# Patient Record
Sex: Female | Born: 1980 | Race: White | Hispanic: No | Marital: Married | Smoking: Never smoker
Health system: Southern US, Community
[De-identification: ages and names within clinical notes are randomized; demographics above are authoritative.]

## PROBLEM LIST (undated history)

## (undated) DIAGNOSIS — G43909 Migraine, unspecified, not intractable, without status migrainosus: Secondary | ICD-10-CM

## (undated) DIAGNOSIS — Z973 Presence of spectacles and contact lenses: Secondary | ICD-10-CM

## (undated) DIAGNOSIS — U071 COVID-19: Secondary | ICD-10-CM

## (undated) DIAGNOSIS — J45909 Unspecified asthma, uncomplicated: Secondary | ICD-10-CM

## (undated) HISTORY — PX: NO PAST SURGERIES: SHX2092

---

## 2015-02-23 DIAGNOSIS — M222X2 Patellofemoral disorders, left knee: Secondary | ICD-10-CM | POA: Insufficient documentation

## 2015-02-23 DIAGNOSIS — M2241 Chondromalacia patellae, right knee: Secondary | ICD-10-CM | POA: Insufficient documentation

## 2015-02-23 DIAGNOSIS — M2242 Chondromalacia patellae, left knee: Secondary | ICD-10-CM | POA: Insufficient documentation

## 2015-02-23 DIAGNOSIS — M222X1 Patellofemoral disorders, right knee: Secondary | ICD-10-CM | POA: Insufficient documentation

## 2015-09-29 ENCOUNTER — Emergency Department (HOSPITAL_COMMUNITY): Payer: BLUE CROSS/BLUE SHIELD

## 2015-09-29 ENCOUNTER — Emergency Department (HOSPITAL_COMMUNITY)
Admission: EM | Admit: 2015-09-29 | Discharge: 2015-09-30 | Disposition: A | Discharge status: Home / Self Care | Payer: BLUE CROSS/BLUE SHIELD | Attending: Emergency Medicine | Admitting: Emergency Medicine

## 2015-09-29 ENCOUNTER — Encounter (HOSPITAL_COMMUNITY): Payer: Self-pay

## 2015-09-29 DIAGNOSIS — J9801 Acute bronchospasm: Secondary | ICD-10-CM | POA: Insufficient documentation

## 2015-09-29 DIAGNOSIS — Z79899 Other long term (current) drug therapy: Secondary | ICD-10-CM | POA: Diagnosis not present

## 2015-09-29 DIAGNOSIS — R0789 Other chest pain: Secondary | ICD-10-CM | POA: Diagnosis present

## 2015-09-29 DIAGNOSIS — R079 Chest pain, unspecified: Secondary | ICD-10-CM

## 2015-09-29 LAB — CBC
HCT: 40.8 % (ref 36.0–46.0)
HEMOGLOBIN: 14.1 g/dL (ref 12.0–15.0)
MCH: 29 pg (ref 26.0–34.0)
MCHC: 34.6 g/dL (ref 30.0–36.0)
MCV: 84 fL (ref 78.0–100.0)
Platelets: 210 10*3/uL (ref 150–400)
RBC: 4.86 MIL/uL (ref 3.87–5.11)
RDW: 13.2 % (ref 11.5–15.5)
WBC: 5.7 10*3/uL (ref 4.0–10.5)

## 2015-09-29 LAB — BASIC METABOLIC PANEL
Anion gap: 7 (ref 5–15)
BUN: 13 mg/dL (ref 6–20)
CALCIUM: 9.8 mg/dL (ref 8.9–10.3)
CO2: 22 mmol/L (ref 22–32)
CREATININE: 0.54 mg/dL (ref 0.44–1.00)
Chloride: 108 mmol/L (ref 101–111)
GFR calc Af Amer: >60 mL/min (ref >60)
GLUCOSE: 99 mg/dL (ref 65–99)
Potassium: 3.7 mmol/L (ref 3.5–5.1)
Sodium: 137 mmol/L (ref 135–145)

## 2015-09-29 LAB — I-STAT TROPONIN, ED: TROPONIN I, POC: 0 ng/mL (ref 0.00–0.08)

## 2015-09-29 MED ORDER — ALBUTEROL SULFATE (2.5 MG/3ML) 0.083% IN NEBU
5.0000 mg | INHALATION_SOLUTION | Freq: Once | RESPIRATORY_TRACT | Status: AC
  Administered 2015-09-29: 5 mg via RESPIRATORY_TRACT
  Filled 2015-09-29: qty 6

## 2015-09-29 MED ORDER — IPRATROPIUM BROMIDE 0.02 % IN SOLN
0.5000 mg | Freq: Once | RESPIRATORY_TRACT | Status: AC
  Administered 2015-09-29: 0.5 mg via RESPIRATORY_TRACT
  Filled 2015-09-29: qty 2.5

## 2015-09-29 NOTE — ED Notes (Signed)
Pt has had cough congestion x 2 days.  No fever. Pt states she started having chest pain approx 20 min ago.  Mid breastbone.  Pain worsens with movement.

## 2015-09-30 LAB — D-DIMER, QUANTITATIVE (NOT AT ARMC)

## 2015-09-30 LAB — I-STAT TROPONIN, ED: Troponin i, poc: 0 ng/mL (ref 0.00–0.08)

## 2015-09-30 MED ORDER — DEXAMETHASONE 4 MG PO TABS
10.0000 mg | ORAL_TABLET | Freq: Once | ORAL | Status: AC
  Administered 2015-09-30: 10 mg via ORAL
  Filled 2015-09-30: qty 2

## 2015-09-30 MED ORDER — ALBUTEROL SULFATE HFA 108 (90 BASE) MCG/ACT IN AERS
1.0000 | INHALATION_SPRAY | RESPIRATORY_TRACT | Status: DC | PRN
  Administered 2015-09-30: 1 via RESPIRATORY_TRACT
  Filled 2015-09-30: qty 6.7

## 2015-09-30 NOTE — ED Provider Notes (Signed)
CSN: [id number]     Arrival date & time 09/29/15  1724 History   First MD Initiated Contact with Patient 09/29/15 2259     Chief Complaint  Patient presents with  . Chest Pain  . Cough     (Consider location/radiation/quality/duration/timing/severity/associated sxs/prior Treatment) HPI Comments: 35 year old female with history of childhood asthma presents for chest pain. The patient states that she has been having trouble with her allergies ever since [place]. She says over the last 2 days her allergies have been especially bad. She reports cough and nasal congestion over this time. About 20 minutes prior to arrival in the emergency department she developed sharp chest pain in the left side of her chest that has been made worse with movement. She also reports sudden onset of shortness of breath at that time. Patient is not on any contraceptives or hormone replacement medications. She denies any history of blood clots. No recent immobility or extensive travel.   History reviewed. No pertinent past medical history. History reviewed. No pertinent past surgical history. History reviewed. No pertinent family history. Social History  Substance Use Topics  . Smoking status: Never Smoker   . Smokeless tobacco: None  . Alcohol Use: No   OB History    No data available     Review of Systems  Constitutional: Negative for fever, chills and fatigue.  HENT: Positive for congestion, postnasal drip and rhinorrhea.   Eyes: Negative for visual disturbance.  Respiratory: Positive for cough, chest tightness and shortness of breath. Negative for wheezing.   Cardiovascular: Positive for chest pain. Negative for palpitations and leg swelling.  Gastrointestinal: Negative for nausea, vomiting, abdominal pain, diarrhea and constipation.  Genitourinary: Negative for dysuria, urgency, frequency and flank pain.  Musculoskeletal: Negative for myalgias and back pain.  Skin:  Negative for rash.  Neurological: Negative for dizziness, weakness and headaches.  Hematological: Does not bruise/bleed easily.      Allergies  Review of patient's allergies indicates no known allergies.  Home Medications   Prior to Admission medications   Medication Sig Start Date End Date Taking? Authorizing Provider  cholecalciferol (VITAMIN D) 1000 units tablet Take 1,000 Units by mouth daily.   Yes Historical Provider, MD   BP 100/64 mmHg  Pulse 88  Temp(Src) 97.6 F (36.4 C) (Oral)  Resp 18  SpO2 99%  LMP 09/07/2015 Physical Exam  Constitutional: She is oriented to person, place, and time. She appears well-developed and well-nourished. No distress.  HENT:  Head: Normocephalic and atraumatic.  Right Ear: External ear normal.  Left Ear: External ear normal.  Nose: Nose normal.  Mouth/Throat: Oropharynx is clear and moist. No oropharyngeal exudate.  Eyes: EOM are normal. Pupils are equal, round, and reactive to light.  Neck: Normal range of motion. Neck supple.  Cardiovascular: Normal rate, regular rhythm, normal heart sounds and intact distal pulses.   No murmur heard. Pulmonary/Chest: Effort normal. No respiratory distress. She has no wheezes. She has no rales. She exhibits no tenderness.  Abdominal: Soft. She exhibits no distension. There is no tenderness.  Musculoskeletal: Normal range of motion. She exhibits no edema or tenderness.  Neurological: She is alert and oriented to person, place, and time.  Skin: Skin is warm and dry. No rash noted. She is not diaphoretic.  Vitals reviewed.   ED Course  Procedures (including critical care time) Labs Review Labs Reviewed  BASIC METABOLIC PANEL  CBC  D-DIMER, QUANTITATIVE (NOT AT [facility])  I-STAT TROPOININ,  ED  Rosezena Sensor, ED    Imaging Review Dg Chest 2 View  09/29/2015  CLINICAL DATA:  New onset central chest pain EXAM: CHEST  2 VIEW COMPARISON:  None. FINDINGS: The heart size and mediastinal contours are  within normal limits. Both lungs are clear. The visualized skeletal structures are unremarkable. IMPRESSION: No active cardiopulmonary disease. Electronically Signed   By: Bary Richard M.D.   On: 09/29/2015 18:23   I have personally reviewed and evaluated these images and lab results as part of my medical decision-making.   EKG Interpretation   Date/Time:  Tuesday Sep 29 2015 17:46:47 EDT Ventricular Rate:  74 PR Interval:  146 QRS Duration: 96 QT Interval:  380 QTC Calculation: 422 R Axis:   63 Text Interpretation:  Sinus rhythm normal.  Confirmed by Donnald Garre, MD,  Lebron Conners [phone]) on 09/29/2015 6:02:53 PM      MDM  Patient was seen and evaluated in stable condition. EKG unremarkable. Troponin 2 normal. D-dimer unremarkable and patient low risk for PE. Patient not tachycardic or hypoxic. Chest x-ray unremarkable. Patient felt improved after breathing treatment. Patient was provided with an albuterol inhaler and also given a dose of Decadron. She was instructed to follow up outpatient. Strict return precautions given.  Patient and husband expressed understanding and agreement with plan of care. Final diagnoses:  Chest pain, unspecified chest pain type  Bronchospasm    1. Bronchospasm 2. Chest pain    Leta Baptist, MD 09/30/15 [phone]

## 2015-09-30 NOTE — Discharge Instructions (Signed)
You were seen and evaluated tonight for chest pain. Likely this is related to what is called bronchospasm which is related to allergies and asthma. Please use the inhaler as prescribed. Please follow up outpatient with the primary care physician for reevaluation.  Bronchospasm, Adult A bronchospasm is a spasm or tightening of the airways going into the lungs. During a bronchospasm breathing becomes more difficult because the airways get smaller. When this happens there can be coughing, a whistling sound when breathing (wheezing), and difficulty breathing. Bronchospasm is often associated with asthma, but not all patients who experience a bronchospasm have asthma. CAUSES  A bronchospasm is caused by inflammation or irritation of the airways. The inflammation or irritation may be triggered by:   Allergies (such as to animals, pollen, food, or mold). Allergens that cause bronchospasm may cause wheezing immediately after exposure or many hours later.   Infection. Viral infections are believed to be the most common cause of bronchospasm.   Exercise.   Irritants (such as pollution, cigarette smoke, strong odors, aerosol sprays, and paint fumes).   Weather changes. Winds increase molds and pollens in the air. Rain refreshes the air by washing irritants out. Cold air may cause inflammation.   Stress and emotional upset.  SIGNS AND SYMPTOMS   Wheezing.   Excessive nighttime coughing.   Frequent or severe coughing with a simple cold.   Chest tightness.   Shortness of breath.  DIAGNOSIS  Bronchospasm is usually diagnosed through a history and physical exam. Tests, such as chest X-rays, are sometimes done to look for other conditions. TREATMENT   Inhaled medicines can be given to open up your airways and help you breathe. The medicines can be given using either an inhaler or a nebulizer machine.  Corticosteroid medicines may be given for severe bronchospasm, usually when it is  associated with asthma. HOME CARE INSTRUCTIONS   Always have a plan prepared for seeking medical care. Know when to call your health care provider and local emergency services (911 in the [place] Know where you can access local emergency care.  Only take medicines as directed by your health care provider.  If you were prescribed an inhaler or nebulizer machine, ask your health care provider to explain how to use it correctly. Always use a spacer with your inhaler if you were given one.  It is necessary to remain calm during an attack. Try to relax and breathe more slowly.  Control your home environment in the following ways:   Change your heating and air conditioning filter at least once a month.   Limit your use of fireplaces and wood stoves.  Do not smoke and do not allow smoking in your home.   Avoid exposure to perfumes and fragrances.   Get rid of pests (such as roaches and mice) and their droppings.   Throw away plants if you see mold on them.   Keep your house clean and dust free.   Replace carpet with wood, tile, or vinyl flooring. Carpet can trap dander and dust.   Use allergy-proof pillows, mattress covers, and box spring covers.   Wash bed sheets and blankets every week in hot water and dry them in a dryer.   Use blankets that are made of polyester or cotton.   Wash hands frequently. SEEK MEDICAL CARE IF:   You have muscle aches.   You have chest pain.   The sputum changes from clear or white to yellow, green, gray, or bloody.   The sputum  you cough up gets thicker.   There are problems that may be related to the medicine you are given, such as a rash, itching, swelling, or trouble breathing.  SEEK IMMEDIATE MEDICAL CARE IF:   You have worsening wheezing and coughing even after taking your prescribed medicines.   You have increased difficulty breathing.   You develop severe chest pain. MAKE SURE YOU:   Understand these  instructions.  Will watch your condition.  Will get help right away if you are not doing well or get worse.   This information is not intended to replace advice given to you by your health care provider. Make sure you discuss any questions you have with your health care provider.   Document Released: 04/21/2003 Document Revised: 05/09/2014 Document Reviewed: 10/08/2012 Elsevier Interactive Patient Education 2016 Elsevier Inc.   Chest Pain  Chest pain can be caused by many different conditions. There is always a chance that your pain could be related to something serious, such as a heart attack or a blood clot in your lungs. Chest pain can also be caused by conditions that are not life-threatening. If you have chest pain, it is very important to follow up with your health care provider. CAUSES  Chest pain can be caused by:  Heartburn.  Pneumonia or bronchitis.  Anxiety or stress.  Inflammation around your heart (pericarditis) or lung (pleuritis or pleurisy).  A blood clot in your lung.  A collapsed lung (pneumothorax). It can develop suddenly on its own (spontaneous pneumothorax) or from trauma to the chest.  Shingles infection (varicella-zoster virus).  Heart attack.  Damage to the bones, muscles, and cartilage that make up your chest wall. This can include:  Bruised bones due to injury.  Strained muscles or cartilage due to frequent or repeated coughing or overwork.  Fracture to one or more ribs.  Sore cartilage due to inflammation (costochondritis). RISK FACTORS  Risk factors for chest pain may include:  Activities that increase your risk for trauma or injury to your chest.  Respiratory infections or conditions that cause frequent coughing.  Medical conditions or overeating that can cause heartburn.  Heart disease or family history of heart disease.  Conditions or health behaviors that increase your risk of developing a blood clot.  Having had chicken pox  (varicella zoster). SIGNS AND SYMPTOMS Chest pain can feel like:  Burning or tingling on the surface of your chest or deep in your chest.  Crushing, pressure, aching, or squeezing pain.  Dull or sharp pain that is worse when you move, cough, or take a deep breath.  Pain that is also felt in your back, neck, shoulder, or arm, or pain that spreads to any of these areas. Your chest pain may come and go, or it may stay constant. DIAGNOSIS Lab tests or other studies may be needed to find the cause of your pain. Your health care provider may have you take a test called an ambulatory ECG (electrocardiogram). An ECG records your heartbeat patterns at the time the test is performed. You may also have other tests, such as:  Transthoracic echocardiogram (TTE). During echocardiography, sound waves are used to create a picture of all of the heart structures and to look at how blood flows through your heart.  Transesophageal echocardiogram (TEE).This is a more advanced imaging test that obtains images from inside your body. It allows your health care provider to see your heart in finer detail.  Cardiac monitoring. This allows your health care provider to monitor  your heart rate and rhythm in real time.  Holter monitor. This is a portable device that records your heartbeat and can help to diagnose abnormal heartbeats. It allows your health care provider to track your heart activity for several days, if needed.  Stress tests. These can be done through exercise or by taking medicine that makes your heart beat more quickly.  Blood tests.  Imaging tests. TREATMENT  Your treatment depends on what is causing your chest pain. Treatment may include:  Medicines. These may include:  Acid blockers for heartburn.  Anti-inflammatory medicine.  Pain medicine for inflammatory conditions.  Antibiotic medicine, if an infection is present.  Medicines to dissolve blood clots.  Medicines to treat coronary  artery disease.  Supportive care for conditions that do not require medicines. This may include:  Resting.  Applying heat or cold packs to injured areas.  Limiting activities until pain decreases. HOME CARE INSTRUCTIONS  If you were prescribed an antibiotic medicine, finish it all even if you start to feel better.  Avoid any activities that bring on chest pain.  Do not use any tobacco products, including cigarettes, chewing tobacco, or electronic cigarettes. If you need help quitting, ask your health care provider.  Do not drink alcohol.  Take medicines only as directed by your health care provider.  Keep all follow-up visits as directed by your health care provider. This is important. This includes any further testing if your chest pain does not go away.  If heartburn is the cause for your chest pain, you may be told to keep your head raised (elevated) while sleeping. This reduces the chance that acid will go from your stomach into your esophagus.  Make lifestyle changes as directed by your health care provider. These may include:  Getting regular exercise. Ask your health care provider to suggest some activities that are safe for you.  Eating a heart-healthy diet. A registered dietitian can help you to learn healthy eating options.  Maintaining a healthy weight.  Managing diabetes, if necessary.  Reducing stress. SEEK MEDICAL CARE IF:  Your chest pain does not go away after treatment.  You have a rash with blisters on your chest.  You have a fever. SEEK IMMEDIATE MEDICAL CARE IF:   Your chest pain is worse.  You have an increasing cough, or you cough up blood.  You have severe abdominal pain.  You have severe weakness.  You faint.  You have chills.  You have sudden, unexplained chest discomfort.  You have sudden, unexplained discomfort in your arms, back, neck, or jaw.  You have shortness of breath at any time.  You suddenly start to sweat, or your  skin gets clammy.  You feel nauseous or you vomit.  You suddenly feel light-headed or dizzy.  Your heart begins to beat quickly, or it feels like it is skipping beats. These symptoms may represent a serious problem that is an emergency. Do not wait to see if the symptoms will go away. Get medical help right away. Call your local emergency services (911 in the [place] Do not drive yourself to the hospital.   This information is not intended to replace advice given to you by your health care provider. Make sure you discuss any questions you have with your health care provider.   Document Released: 01/26/2005 Document Revised: 05/09/2014 Document Reviewed: 11/22/2013 Elsevier Interactive Patient Education [employer]

## 2016-11-28 LAB — OB RESULTS CONSOLE GC/CHLAMYDIA
Chlamydia: NEGATIVE
GC PROBE AMP, GENITAL: NEGATIVE

## 2016-11-28 LAB — OB RESULTS CONSOLE ABO/RH: RH Type: POSITIVE

## 2016-11-28 LAB — OB RESULTS CONSOLE RUBELLA ANTIBODY, IGM: RUBELLA: IMMUNE

## 2016-11-28 LAB — OB RESULTS CONSOLE HIV ANTIBODY (ROUTINE TESTING): HIV: NONREACTIVE

## 2016-11-28 LAB — OB RESULTS CONSOLE HEPATITIS B SURFACE ANTIGEN: Hepatitis B Surface Ag: NEGATIVE

## 2016-11-28 LAB — OB RESULTS CONSOLE ANTIBODY SCREEN: ANTIBODY SCREEN: NEGATIVE

## 2016-11-28 LAB — OB RESULTS CONSOLE RPR: RPR: NONREACTIVE

## 2017-05-02 NOTE — L&D Delivery Note (Signed)
Delivery Note Pt reached complete and had prolonged deceleration.  Was in hands and knees when provider arrived.  Pt complete/complete and zero station.  Pt pushed very well for 20 min.  After several pushes with severe variables baby to +2, a vacuum was applied after verbal consent with 1 ctx and 1 pull had a pop-off.  With next contraction had excellent progress, decision made to proceed w/o vacuum. With great effort and 3 more pushes pt delivered.   At 2:53 AM a viable and healthy female was delivered via Vaginal, Spontaneous (Presentation:OA; LOT ).  APGAR: 9, 9; weight P .   Placenta status: delivered, intact .  Cord: 3V with the following complications: none.    Anesthesia:  epidural Episiotomy:  none Lacerations:  Vaginal laceratiion x 3 Suture Repair: 3.0 vicryl rapide Est. Blood Loss (mL):  200cc  Mom to postpartum.  Baby to Couplet care / Skin to Skin.  Yesenia [name] 06/13/2017, 3:17 AM  Br/A+/RI/Tdap in PNC/Contra ?

## 2017-05-03 LAB — OB RESULTS CONSOLE GBS: STREP GROUP B AG: NEGATIVE

## 2017-06-01 ENCOUNTER — Encounter (HOSPITAL_COMMUNITY): Payer: Self-pay | Admitting: *Deleted

## 2017-06-01 ENCOUNTER — Telehealth (HOSPITAL_COMMUNITY): Payer: Self-pay | Admitting: *Deleted

## 2017-06-01 NOTE — Telephone Encounter (Signed)
Preadmission screen  

## 2017-06-12 ENCOUNTER — Inpatient Hospital Stay (HOSPITAL_COMMUNITY)
Admission: AD | Admit: 2017-06-12 | Discharge: 2017-06-14 | DRG: 807 | Disposition: A | Discharge status: Home / Self Care | Payer: Managed Care, Other (non HMO) | Source: Ambulatory Visit | Attending: Obstetrics and Gynecology | Admitting: Obstetrics and Gynecology

## 2017-06-12 ENCOUNTER — Other Ambulatory Visit: Payer: Self-pay

## 2017-06-12 ENCOUNTER — Encounter (HOSPITAL_COMMUNITY): Payer: Self-pay | Admitting: *Deleted

## 2017-06-12 DIAGNOSIS — O9902 Anemia complicating childbirth: Secondary | ICD-10-CM | POA: Diagnosis present

## 2017-06-12 DIAGNOSIS — E669 Obesity, unspecified: Secondary | ICD-10-CM | POA: Diagnosis present

## 2017-06-12 DIAGNOSIS — O99214 Obesity complicating childbirth: Secondary | ICD-10-CM | POA: Diagnosis present

## 2017-06-12 DIAGNOSIS — Z3A40 40 weeks gestation of pregnancy: Secondary | ICD-10-CM

## 2017-06-12 DIAGNOSIS — D649 Anemia, unspecified: Secondary | ICD-10-CM | POA: Diagnosis present

## 2017-06-12 DIAGNOSIS — O4292 Full-term premature rupture of membranes, unspecified as to length of time between rupture and onset of labor: Secondary | ICD-10-CM | POA: Diagnosis present

## 2017-06-12 HISTORY — DX: Unspecified asthma, uncomplicated: J45.909

## 2017-06-12 LAB — CBC
HEMATOCRIT: 34.6 % — AB (ref 36.0–46.0)
Hemoglobin: 11.9 g/dL — ABNORMAL LOW (ref 12.0–15.0)
MCH: 28 pg (ref 26.0–34.0)
MCHC: 34.4 g/dL (ref 30.0–36.0)
MCV: 81.4 fL (ref 78.0–100.0)
Platelets: 195 10*3/uL (ref 150–400)
RBC: 4.25 MIL/uL (ref 3.87–5.11)
RDW: 14 % (ref 11.5–15.5)
WBC: 8.6 10*3/uL (ref 4.0–10.5)

## 2017-06-12 LAB — TYPE AND SCREEN
ABO/RH(D): A POS
Antibody Screen: NEGATIVE

## 2017-06-12 LAB — POCT FERN TEST: POCT FERN TEST: POSITIVE

## 2017-06-12 LAB — ABO/RH: ABO/RH(D): A POS

## 2017-06-12 MED ORDER — EPHEDRINE 5 MG/ML INJ
10.0000 mg | INTRAVENOUS | Status: DC | PRN
  Filled 2017-06-12: qty 2

## 2017-06-12 MED ORDER — PHENYLEPHRINE 40 MCG/ML (10ML) SYRINGE FOR IV PUSH (FOR BLOOD PRESSURE SUPPORT)
80.0000 ug | PREFILLED_SYRINGE | INTRAVENOUS | Status: DC | PRN
  Filled 2017-06-12: qty 10
  Filled 2017-06-12: qty 5

## 2017-06-12 MED ORDER — OXYCODONE-ACETAMINOPHEN 5-325 MG PO TABS: 1.0000 | ORAL_TABLET | ORAL | Status: DC | PRN

## 2017-06-12 MED ORDER — OXYCODONE-ACETAMINOPHEN 5-325 MG PO TABS: 2.0000 | ORAL_TABLET | ORAL | Status: DC | PRN

## 2017-06-12 MED ORDER — ACETAMINOPHEN 325 MG PO TABS: 650.0000 mg | ORAL_TABLET | ORAL | Status: DC | PRN

## 2017-06-12 MED ORDER — LACTATED RINGERS IV SOLN
INTRAVENOUS | Status: DC
  Administered 2017-06-12 – 2017-06-13 (×2): via INTRAVENOUS

## 2017-06-12 MED ORDER — OXYTOCIN 40 UNITS IN LACTATED RINGERS INFUSION - SIMPLE MED
2.5000 [IU]/h | INTRAVENOUS | Status: DC
  Administered 2017-06-13: 2.5 [IU]/h via INTRAVENOUS

## 2017-06-12 MED ORDER — OXYTOCIN BOLUS FROM INFUSION
500.0000 mL | Freq: Once | INTRAVENOUS | Status: AC
  Administered 2017-06-13: 500 mL via INTRAVENOUS

## 2017-06-12 MED ORDER — LIDOCAINE HCL (PF) 1 % IJ SOLN
30.0000 mL | INTRAMUSCULAR | Status: DC | PRN
  Filled 2017-06-12: qty 30

## 2017-06-12 MED ORDER — SOD CITRATE-CITRIC ACID 500-334 MG/5ML PO SOLN: 30.0000 mL | ORAL | Status: DC | PRN

## 2017-06-12 MED ORDER — OXYTOCIN 40 UNITS IN LACTATED RINGERS INFUSION - SIMPLE MED
1.0000 m[IU]/min | INTRAVENOUS | Status: DC
  Administered 2017-06-12: 2 m[IU]/min via INTRAVENOUS
  Filled 2017-06-12: qty 1000

## 2017-06-12 MED ORDER — BUTORPHANOL TARTRATE 1 MG/ML IJ SOLN: 1.0000 mg | INTRAMUSCULAR | Status: DC | PRN

## 2017-06-12 MED ORDER — TERBUTALINE SULFATE 1 MG/ML IJ SOLN
0.2500 mg | Freq: Once | INTRAMUSCULAR | Status: DC | PRN
  Filled 2017-06-12: qty 1

## 2017-06-12 MED ORDER — ONDANSETRON HCL 4 MG/2ML IJ SOLN: 4.0000 mg | Freq: Four times a day (QID) | INTRAMUSCULAR | Status: DC | PRN

## 2017-06-12 MED ORDER — LACTATED RINGERS IV SOLN: 500.0000 mL | Freq: Once | INTRAVENOUS | Status: DC

## 2017-06-12 MED ORDER — PHENYLEPHRINE 40 MCG/ML (10ML) SYRINGE FOR IV PUSH (FOR BLOOD PRESSURE SUPPORT)
80.0000 ug | PREFILLED_SYRINGE | INTRAVENOUS | Status: DC | PRN
  Filled 2017-06-12: qty 5

## 2017-06-12 MED ORDER — LACTATED RINGERS IV SOLN: 500.0000 mL | INTRAVENOUS | Status: DC | PRN

## 2017-06-12 MED ORDER — DIPHENHYDRAMINE HCL 50 MG/ML IJ SOLN: 12.5000 mg | INTRAMUSCULAR | Status: DC | PRN

## 2017-06-12 MED ORDER — FENTANYL 2.5 MCG/ML BUPIVACAINE 1/10 % EPIDURAL INFUSION (WH - ANES)
14.0000 mL/h | INTRAMUSCULAR | Status: DC | PRN
  Administered 2017-06-13: 14 mL/h via EPIDURAL
  Filled 2017-06-12: qty 100

## 2017-06-12 NOTE — Anesthesia Pain Management Evaluation Note (Signed)
  CRNA Pain Management Visit Note  Patient: Yesenia [name], 37 y.o., female  "Hello I am a member of the anesthesia team at [facility]. We have an anesthesia team available at all times to provide care throughout the hospital, including epidural management and anesthesia for C-section. I don't know your plan for the delivery whether it a natural birth, water birth, IV sedation, nitrous supplementation, doula or epidural, but we want to meet your pain goals."   1.Was your pain managed to your expectations on prior hospitalizations?   Yes   2.What is your expectation for pain management during this hospitalization?     Epidural  3.How can we help you reach that goal? Epidural   Record the patient's initial score and the patient's pain goal.   Pain: 8  Pain Goal: 3 [facility] wants you to be able to say your pain was always managed very well.  Yesenia [name] 06/12/2017

## 2017-06-12 NOTE — MAU Note (Signed)
Pt presents with c/o LOF since 1615.  Reports fluid is brown, has had blood tinged discharge all day.  Denies VB.  Reports +FM.   Scheduled for IOL tomorrow.

## 2017-06-12 NOTE — H&P (Signed)
Richard MiuStephanie Ishaq is a 37 y.o. female G3P2002 at 8140+ with ROM for moderate meconium. ROM at 4:30pm.  [facility].    OB History    Gravida Para Term Preterm AB Living   3 2 2     2    SAB TAB Ectopic Multiple Live Births           2    G1 and G2 - SVD x 2 IOL - 8#7 and 8#9 (2014, 2016) G3 present  No abn pap, last 2016 No STDs  Past Medical History:  Diagnosis Date  . Asthma   migraines  History reviewed. No pertinent surgical history.   Family History: family history includes Diabetes in her father; Hypertension in her father and mother; Kidney disease in her father.   Social History:  reports that  has never smoked. she has never used smokeless tobacco. She reports that she does not drink alcohol or use drugs.  Married, Solicitortrains principals and asst IT trainerprincipals  Meds Butalbital and PNV All NKDA     Maternal Diabetes: No Genetic Screening: Normal Maternal Ultrasounds/Referrals: Normal Fetal Ultrasounds or other Referrals:  None Maternal Substance Abuse:  No Significant Maternal Medications:  None Significant Maternal Lab Results:  Lab values include: Group B Strep negative Other Comments:  Panorama low risk  Review of Systems  Constitutional: Negative.   HENT: Negative.   Eyes: Negative.   Respiratory: Negative.   Cardiovascular: Negative.   Gastrointestinal: Negative.   Genitourinary: Negative.   Musculoskeletal: Positive for back pain.  Skin: Negative.   Neurological: Negative.   Psychiatric/Behavioral: Negative.    Maternal Medical History:  Reason for admission: Rupture of membranes and contractions.   Contractions: Frequency: irregular.    Fetal activity: Perceived fetal activity is normal.    Prenatal Complications - Diabetes: none.    Dilation: 4 Effacement (%): 50 Station: -2 Exam by:: Dr. Ellyn HackBovard Blood pressure 127/76, pulse 76, temperature 98.6 F (37 C), temperature source Axillary, resp. rate 18,  height 5\' 5"  (1.651 m), weight 96.3 kg (212 lb 4 oz), SpO2 99 %. Maternal Exam:  Abdomen: Fundal height is appropriate for gestation.   Estimated fetal weight is 8-9#.   Fetal presentation: vertex  Pelvis: adequate for delivery.   Cervix: Cervix evaluated by digital exam.     Physical Exam  Constitutional: She is oriented to person, place, and time. She appears well-developed and well-nourished.  HENT:  Head: Normocephalic and atraumatic.  Cardiovascular: Normal rate and regular rhythm.  Respiratory: Effort normal and breath sounds normal. No respiratory distress. She has no wheezes.  GI: Soft. Bowel sounds are normal. She exhibits no distension. There is no tenderness.  Musculoskeletal: Normal range of motion.  Neurological: She is alert and oriented to person, place, and time.  Skin: Skin is warm and dry.  Psychiatric: She has a normal mood and affect. Her behavior is normal.    Prenatal labs: ABO, Rh: --/--/A POS (02/11 1753) Antibody: NEG (02/11 1753) Rubella: Immune (07/30 0000) RPR: Nonreactive (07/30 0000)  HBsAg: Negative (07/30 0000)  HIV: Non-reactive (07/30 0000)  GBS: Negative (01/02 0000)   Hgb 13.6/Plt 238/Ur Cx neg/ GC neg/Chl neg/RNI/AFP WNL/Panorama low risk female/glucola 130  Nl anat x heart, cwd Completed anat - cwd, nl anat, ant plac  Received Tdap and flu vaccines in pregnancy  Assessment/Plan: 36yo G3P2 at 40+ with ROM Expect SVD Epidural prn for pain Pitocin to augment   Shakemia Madera Bovard-Stuckert 06/12/2017, 7:08 PM

## 2017-06-12 NOTE — Progress Notes (Signed)
Patient ID: Yesenia MiuStephanie [name], female   DOB: August 07, 1980, 37 y.o.   MRN: [id number]   Uncomfortable with ctx  AFVSS gen NAD FHTs 135, mod var, + accels, category 1 toco  q 3-525min  IUPC placed w/o diff/comp  5.5/70/-1  Continue current mgmt, adjust pitocin prn

## 2017-06-13 ENCOUNTER — Inpatient Hospital Stay (HOSPITAL_COMMUNITY): Payer: Managed Care, Other (non HMO) | Admitting: Anesthesiology

## 2017-06-13 ENCOUNTER — Encounter (HOSPITAL_COMMUNITY): Payer: Self-pay | Admitting: Anesthesiology

## 2017-06-13 ENCOUNTER — Inpatient Hospital Stay (HOSPITAL_COMMUNITY): Admission: RE | Admit: 2017-06-13 | Payer: Managed Care, Other (non HMO) | Source: Ambulatory Visit

## 2017-06-13 LAB — RPR: RPR: NONREACTIVE

## 2017-06-13 MED ORDER — SIMETHICONE 80 MG PO CHEW: 80.0000 mg | CHEWABLE_TABLET | ORAL | Status: DC | PRN

## 2017-06-13 MED ORDER — OXYCODONE HCL 5 MG PO TABS: 5.0000 mg | ORAL_TABLET | ORAL | Status: DC | PRN

## 2017-06-13 MED ORDER — ZOLPIDEM TARTRATE 5 MG PO TABS: 5.0000 mg | ORAL_TABLET | Freq: Every evening | ORAL | Status: DC | PRN

## 2017-06-13 MED ORDER — BENZOCAINE-MENTHOL 20-0.5 % EX AERO
1.0000 "application " | INHALATION_SPRAY | CUTANEOUS | Status: DC | PRN
  Administered 2017-06-13: 1 via TOPICAL
  Filled 2017-06-13: qty 56

## 2017-06-13 MED ORDER — DIPHENHYDRAMINE HCL 25 MG PO CAPS: 25.0000 mg | ORAL_CAPSULE | Freq: Four times a day (QID) | ORAL | Status: DC | PRN

## 2017-06-13 MED ORDER — SENNOSIDES-DOCUSATE SODIUM 8.6-50 MG PO TABS
2.0000 | ORAL_TABLET | ORAL | Status: DC
  Administered 2017-06-14: 2 via ORAL
  Filled 2017-06-13: qty 2

## 2017-06-13 MED ORDER — ONDANSETRON HCL 4 MG PO TABS: 4.0000 mg | ORAL_TABLET | ORAL | Status: DC | PRN

## 2017-06-13 MED ORDER — MISOPROSTOL 200 MCG PO TABS
800.0000 ug | ORAL_TABLET | Freq: Once | ORAL | Status: AC
  Administered 2017-06-13: 800 ug via BUCCAL
  Filled 2017-06-13: qty 4

## 2017-06-13 MED ORDER — LIDOCAINE HCL (PF) 1 % IJ SOLN
INTRAMUSCULAR | Status: DC | PRN
  Administered 2017-06-13 (×2): 4 mL via EPIDURAL

## 2017-06-13 MED ORDER — PRENATAL MULTIVITAMIN CH
1.0000 | ORAL_TABLET | Freq: Every day | ORAL | Status: DC
  Administered 2017-06-13: 1 via ORAL
  Filled 2017-06-13: qty 1

## 2017-06-13 MED ORDER — ACETAMINOPHEN 325 MG PO TABS
650.0000 mg | ORAL_TABLET | ORAL | Status: DC | PRN
  Administered 2017-06-13: 650 mg via ORAL
  Filled 2017-06-13: qty 2

## 2017-06-13 MED ORDER — LACTATED RINGERS IV SOLN
500.0000 mL | Freq: Once | INTRAVENOUS | Status: AC
  Administered 2017-06-12: 500 mL via INTRAVENOUS

## 2017-06-13 MED ORDER — IBUPROFEN 600 MG PO TABS
600.0000 mg | ORAL_TABLET | Freq: Four times a day (QID) | ORAL | Status: DC
  Administered 2017-06-13 – 2017-06-14 (×5): 600 mg via ORAL
  Filled 2017-06-13 (×5): qty 1

## 2017-06-13 MED ORDER — OXYCODONE HCL 5 MG PO TABS: 10.0000 mg | ORAL_TABLET | ORAL | Status: DC | PRN

## 2017-06-13 MED ORDER — COCONUT OIL OIL: 1.0000 "application " | TOPICAL_OIL | Status: DC | PRN

## 2017-06-13 MED ORDER — WITCH HAZEL-GLYCERIN EX PADS: 1.0000 "application " | MEDICATED_PAD | CUTANEOUS | Status: DC | PRN

## 2017-06-13 MED ORDER — LACTATED RINGERS IV SOLN: INTRAVENOUS | Status: DC

## 2017-06-13 MED ORDER — DIBUCAINE 1 % RE OINT: 1.0000 "application " | TOPICAL_OINTMENT | RECTAL | Status: DC | PRN

## 2017-06-13 MED ORDER — MEASLES, MUMPS & RUBELLA VAC ~~LOC~~ INJ: 0.5000 mL | INJECTION | Freq: Once | SUBCUTANEOUS | Status: DC

## 2017-06-13 MED ORDER — ONDANSETRON HCL 4 MG/2ML IJ SOLN: 4.0000 mg | INTRAMUSCULAR | Status: DC | PRN

## 2017-06-13 NOTE — Anesthesia Procedure Notes (Signed)
Epidural Patient location during procedure: OB Start time: 06/13/2017 12:07 AM  Staffing Anesthesiologist: Mal AmabileFoster, Exzavier Ruderman, MD Performed: anesthesiologist   Preanesthetic Checklist Completed: patient identified, site marked, surgical consent, pre-op evaluation, timeout performed, IV checked, risks and benefits discussed and monitors and equipment checked  Epidural Patient position: sitting Prep: site prepped and draped and DuraPrep Patient monitoring: continuous pulse ox and blood pressure Approach: midline Location: L3-L4 Injection technique: LOR air  Needle:  Needle type: Tuohy  Needle gauge: 17 G Needle length: 9 cm and 9 Needle insertion depth: 5 cm cm Catheter type: closed end flexible Catheter size: 19 Gauge Catheter at skin depth: 10 cm Test dose: negative and Other  Assessment Events: blood not aspirated, injection not painful, no injection resistance, negative IV test and no paresthesia  Additional Notes Patient identified. Risks and benefits discussed including failed block, incomplete  Pain control, post dural puncture headache, nerve damage, paralysis, blood pressure Changes, nausea, vomiting, reactions to medications-both toxic and allergic and post Partum back pain. All questions were answered. Patient expressed understanding and wished to proceed. Sterile technique was used throughout procedure. Epidural site was Dressed with sterile barrier dressing. No paresthesias, signs of intravascular injection Or signs of intrathecal spread were encountered.  Patient was more comfortable after the epidural was dosed. Please see RN's note for documentation of vital signs and FHR which are stable.

## 2017-06-13 NOTE — Consult Note (Signed)
NICU delivery team called to attend the delivery for fetal bradycardia.  Team dismissed by Dr. Ellyn HackBovard right after infant was delivered and came out crying vigorously.   Overton MamMary Ann T Kevia Zaucha, MD (Attending Neonatologist)

## 2017-06-13 NOTE — Lactation Note (Signed)
This note was copied from a baby's chart. Lactation Consultation Note  Patient Name: Yesenia [name]: 06/13/2017 Reason for consult: Initial assessment;Term Breastfeeding consultation services and support information given and reviewed.  This is mom's third baby and she breastfed previous babies without difficulty.  Newborn is 8 hours old and hasn't latched but spoon fed twice.  Assisted with positioning baby in football hold on right side.  Baby has a recessed chin.  Colostrum easily expressed by hand expression.  Baby opened and latched well suckling off and on for 5 minutes then fell asleep.  Waking techniques and breast massage done but baby showing no interest in feeding.  Instructed to feed with cues and call out for assist prn.  Maternal Data Has patient been taught Hand Expression?: Yes Does the patient have breastfeeding experience prior to this delivery?: Yes  Feeding Feeding Type: Breast Fed Length of feed: 5 min  LATCH Score Latch: Grasps breast easily, tongue down, lips flanged, rhythmical sucking.  Audible Swallowing: None  Type of Nipple: Everted at rest and after stimulation  Comfort (Breast/Nipple): Soft / non-tender  Hold (Positioning): Assistance needed to correctly position infant at breast and maintain latch.  LATCH Score: 7  Interventions Interventions: Breast feeding basics reviewed;Assisted with latch;Breast compression;Skin to skin;Adjust position;Breast massage;Support pillows;Hand express;Position options  Lactation Tools Discussed/Used     Consult Status Consult Status: Follow-up [name]: 06/14/17 Follow-up type: In-patient    Huston FoleyMOULDEN, Alijah Hyde S 06/13/2017, 11:45 AM

## 2017-06-13 NOTE — Progress Notes (Signed)
DOD Subjective: no complaints except tired.  Pain minimal  Objective: Blood pressure 124/61, pulse 79, temperature 99 F (37.2 C), temperature source Oral, resp. rate 18, height 5\' 5"  (1.651 m), weight 96.3 kg (212 lb 4 oz), SpO2 100 %.  Physical Exam:  General: alert and cooperative Lochia: appropriate Uterine Fundus: firm   Recent Labs    06/12/17 1753  HGB 11.9*  HCT 34.6*    Assessment/Plan: Plan for discharge tomorrow if patient doing well.   LOS: 1 day   Oliver PilaKathy W Adriel [name] 06/13/2017, 9:29 AM

## 2017-06-13 NOTE — Anesthesia Preprocedure Evaluation (Addendum)
Anesthesia Evaluation  Patient identified by MRN, date of birth, ID band Patient awake    Reviewed: Allergy & Precautions, Patient's Chart, lab work & pertinent test results  Airway Mallampati: II  TM Distance: >3 FB Neck ROM: Full    Dental no notable dental hx. (+) Teeth Intact   Pulmonary asthma ,    Pulmonary exam normal breath sounds clear to auscultation       Cardiovascular negative cardio ROS Normal cardiovascular exam Rhythm:Regular Rate:Normal     Neuro/Psych negative neurological ROS  negative psych ROS   GI/Hepatic Neg liver ROS, GERD  ,  Endo/Other  Obesity  Renal/GU negative Renal ROS  negative genitourinary   Musculoskeletal negative musculoskeletal ROS (+)   Abdominal (+) + obese,   Peds  Hematology  (+) anemia ,   Anesthesia Other Findings   Reproductive/Obstetrics (+) Pregnancy                             Anesthesia Physical Anesthesia Plan  ASA: II  Anesthesia Plan: Epidural   Post-op Pain Management:    Induction:   PONV Risk Score and Plan:   Airway Management Planned: Natural Airway  Additional Equipment:   Intra-op Plan:   Post-operative Plan:   Informed Consent: I have reviewed the patients History and Physical, chart, labs and discussed the procedure including the risks, benefits and alternatives for the proposed anesthesia with the patient or authorized representative who has indicated his/her understanding and acceptance.     Plan Discussed with: Anesthesiologist  Anesthesia Plan Comments:         Anesthesia Quick Evaluation  

## 2017-06-13 NOTE — Anesthesia Postprocedure Evaluation (Signed)
Anesthesia Post Note  Patient: Richard MiuStephanie Snowberger  Procedure(s) Performed: AN AD HOC LABOR EPIDURAL     Patient location during evaluation: Mother Baby Anesthesia Type: Epidural Level of consciousness: awake and alert Pain management: pain level controlled Vital Signs Assessment: post-procedure vital signs reviewed and stable Respiratory status: spontaneous breathing, nonlabored ventilation and respiratory function stable Cardiovascular status: stable Postop Assessment: no headache, no backache and epidural receding Anesthetic complications: no    Last Vitals:  Vitals:   06/13/17 0500 06/13/17 0600  BP: (!) 125/52 124/61  Pulse: 76 79  Resp:  18  Temp: 37.6 C 37.2 C  SpO2:      Last Pain:  Vitals:   06/13/17 0600  TempSrc: Oral  PainSc:    Pain Goal: Patients Stated Pain Goal: 7 (06/12/17 1800)               Marrion CoyMERRITT,Gregorio Worley

## 2017-06-14 ENCOUNTER — Encounter (HOSPITAL_COMMUNITY): Payer: Self-pay | Admitting: *Deleted

## 2017-06-14 LAB — CBC
HCT: 30.4 % — ABNORMAL LOW (ref 36.0–46.0)
Hemoglobin: 10.1 g/dL — ABNORMAL LOW (ref 12.0–15.0)
MCH: 27.2 pg (ref 26.0–34.0)
MCHC: 33.2 g/dL (ref 30.0–36.0)
MCV: 81.9 fL (ref 78.0–100.0)
PLATELETS: 178 10*3/uL (ref 150–400)
RBC: 3.71 MIL/uL — ABNORMAL LOW (ref 3.87–5.11)
RDW: 14.3 % (ref 11.5–15.5)
WBC: 5.9 10*3/uL (ref 4.0–10.5)

## 2017-06-14 MED ORDER — IBUPROFEN 600 MG PO TABS: 600.0000 mg | ORAL_TABLET | Freq: Four times a day (QID) | ORAL | 0 refills | Status: DC

## 2017-06-14 NOTE — Lactation Note (Signed)
This note was copied from a baby's chart. Lactation Consultation Note Baby 23 hrs old. Baby screaming, will not latch. Baby has very high palate, over bite and under bite.  Mom has med. Large everted nipples. Baby has small mouth. Applied #20 NS. LC feels that #24 would fit better for mom's nipple.  Hand expressed colostrum, inserted into NS w/curve tip syring. Baby wouldn't latch, only scream.  Baby taken from breast, mom held STS.  Mom stated baby hasn't slept much. Discussed cluster feeding. Asked mom to call for next feeding for latching assistance and NS fitting.   Mom's 3rd baby. Her 1st baby had mouth and jaw like this baby, eventually adjusted. Didn't use NS. \  Patient Name: Yesenia [name] UJWJX'BToday's Date: 06/14/2017 Reason for consult: Follow-up assessment;Difficult latch   Maternal Data    Feeding Feeding Type: Breast Fed Length of feed: 0 min  LATCH Score Latch: Too sleepy or reluctant, no latch achieved, no sucking elicited.  Audible Swallowing: None  Type of Nipple: Everted at rest and after stimulation  Comfort (Breast/Nipple): Soft / non-tender  Hold (Positioning): Assistance needed to correctly position infant at breast and maintain latch.  LATCH Score: 5  Interventions Interventions: Breast feeding basics reviewed;Assisted with latch;Breast compression;Skin to skin;Adjust position;Breast massage;Support pillows;Hand express;Position options;Expressed milk  Lactation Tools Discussed/Used     Consult Status Consult Status: Follow-up Date: 06/14/17 Follow-up type: In-patient    Charyl DancerCARVER, Rajon Bisig G 06/14/2017, 2:51 AM

## 2017-06-14 NOTE — Discharge Instructions (Signed)
As per discharge pamphlet °

## 2017-06-14 NOTE — Progress Notes (Signed)
PPD #1 No problems, wants to go home Afeb, VSS Fundus firm, NT at U-1 Continue routine postpartum care, d/c home if baby able to go 

## 2017-06-14 NOTE — Lactation Note (Signed)
This note was copied from a baby's chart. Lactation Consultation Note  Patient Name: Girl Richard MiuStephanie Ovens [id number]'WToday's Date: 06/14/2017 Reason for consult: Follow-up assessment;Mother's request Mom latched baby well using the cross cradle hold.  Observed active suck/swallows.  Instructed to feed with cues using good breast massage during feeding.  Lactation outpatient services and support reviewed and encouraged prn.  Maternal Data    Feeding Feeding Type: Breast Fed Length of feed: 10 min  LATCH Score Latch: Grasps breast easily, tongue down, lips flanged, rhythmical sucking.  Audible Swallowing: A few with stimulation  Type of Nipple: Everted at rest and after stimulation  Comfort (Breast/Nipple): Soft / non-tender  Hold (Positioning): No assistance needed to correctly position infant at breast.  LATCH Score: 9  Interventions    Lactation Tools Discussed/Used     Consult Status Consult Status: Complete    Huston FoleyMOULDEN, Zyriah Mask S 06/14/2017, 10:28 AM

## 2017-06-14 NOTE — Discharge Summary (Signed)
OB Discharge Summary     Patient Name: Richard MiuStephanie Reller DOB: [birth year] MRN: [id number]  Date of admission: 06/12/2017 Delivering MD: Sherian ReinBOVARD-STUCKERT, JODY   Date of discharge: 06/14/2017  Admitting diagnosis: 40.5WKS WATER BROKE Intrauterine pregnancy: 15101w0d     Secondary diagnosis:  Principal Problem:   SVD (spontaneous vaginal delivery) Active Problems:   Indication for care in labor or delivery   Liveborn infant      Discharge diagnosis: Term Pregnancy Delivered                                  Hospital course:  Onset of Labor With Vaginal Delivery     37 y.o. yo G3P2002 at 40101w0d was admitted in Active Labor on 06/12/2017. Patient had an uncomplicated labor course as follows:  Membrane Rupture Time/Date: 4:15 PM ,06/12/2017   Intrapartum Procedures: Episiotomy:                                           Lacerations:     Patient had a delivery of a Viable infant. 06/13/2017  Information for the patient's newborn:  Lowella Delldams, Girl Vista [[id number]][[id number]]  Delivery Method: Vaginal, Spontaneous(Filed from Delivery Summary)    Pateint had an uncomplicated postpartum course.  She is ambulating, tolerating a regular diet, passing flatus, and urinating well. Patient is discharged home in stable condition on 06/14/17.   Physical exam  Vitals:   06/13/17 0600 06/13/17 1035 06/13/17 1750 06/14/17 0622  BP: 124/61 122/70 120/70 124/79  Pulse: 79 90 73 74  Resp: 18 18 18 18   Temp: 99 F (37.2 C) 98 F (36.7 C) 98.2 F (36.8 C) 97.8 F (36.6 C)  TempSrc: Oral Oral Oral Oral  SpO2:    99%  Weight:      Height:       General: alert Lochia: appropriate Uterine Fundus: firm  Labs: Lab Results  Component Value Date   WBC 5.9 06/14/2017   HGB 10.1 (L) 06/14/2017   HCT 30.4 (L) 06/14/2017   MCV 81.9 06/14/2017   PLT 178 06/14/2017   CMP Latest Ref Rng & Units 09/29/2015  Glucose 65 - 99 mg/dL 99  BUN 6 - 20 mg/dL 13  Creatinine 1.300.44 - 8.651.00 mg/dL 7.840.54  Sodium 696135 - 295145 mmol/L  137  Potassium 3.5 - 5.1 mmol/L 3.7  Chloride 101 - 111 mmol/L 108  CO2 22 - 32 mmol/L 22  Calcium 8.9 - 10.3 mg/dL 9.8    Discharge instruction: per After Visit Summary and "Baby and Me Booklet".  After visit meds:  Allergies as of 06/14/2017   No Known Allergies     Medication List    TAKE these medications   ibuprofen 600 MG tablet Commonly known as:  ADVIL,MOTRIN Take 1 tablet (600 mg total) by mouth every 6 (six) hours.   prenatal multivitamin Tabs tablet Take 1 tablet by mouth daily at 12 noon.       Diet: routine diet  Activity: Advance as tolerated. Pelvic rest for 6 weeks.   Outpatient follow up:6 weeks  Newborn Data: Live born female  Birth Weight: 7 lb 4.5 oz (3303 g) APGAR: 9, 9  Newborn Delivery   Birth date/time:  06/13/2017 02:53:00 Delivery type:  Vaginal, Spontaneous     Baby Feeding: Breast Disposition:home with mother  06/14/2017 Zenaida Niece, MD

## 2017-06-14 NOTE — Lactation Note (Signed)
This note was copied from a baby's chart. Lactation Consultation Note  Patient Name: Yesenia Richard MiuStephanie Popp [id number]'YToday's Date: 06/14/2017  Mom states last two feedings went well.  No nipple shield needed.  Mom will call with next feeding for assessment.  Phone number left.   Maternal Data    Feeding Feeding Type: Breast Fed  LATCH Score Latch: Grasps breast easily, tongue down, lips flanged, rhythmical sucking.  Audible Swallowing: A few with stimulation  Type of Nipple: Everted at rest and after stimulation  Comfort (Breast/Nipple): Soft / non-tender  Hold (Positioning): Assistance needed to correctly position infant at breast and maintain latch.  LATCH Score: 8  Interventions    Lactation Tools Discussed/Used     Consult Status      Huston FoleyMOULDEN, Zahriah Roes S 06/14/2017, 9:00 AM

## 2017-08-11 IMAGING — CR DG CHEST 2V
2 series · 2 of 2 positions shown · non-contrast
Comparison: None.

CLINICAL DATA: New onset central chest pain

EXAM:
CHEST  2 VIEW

[w chest pa]
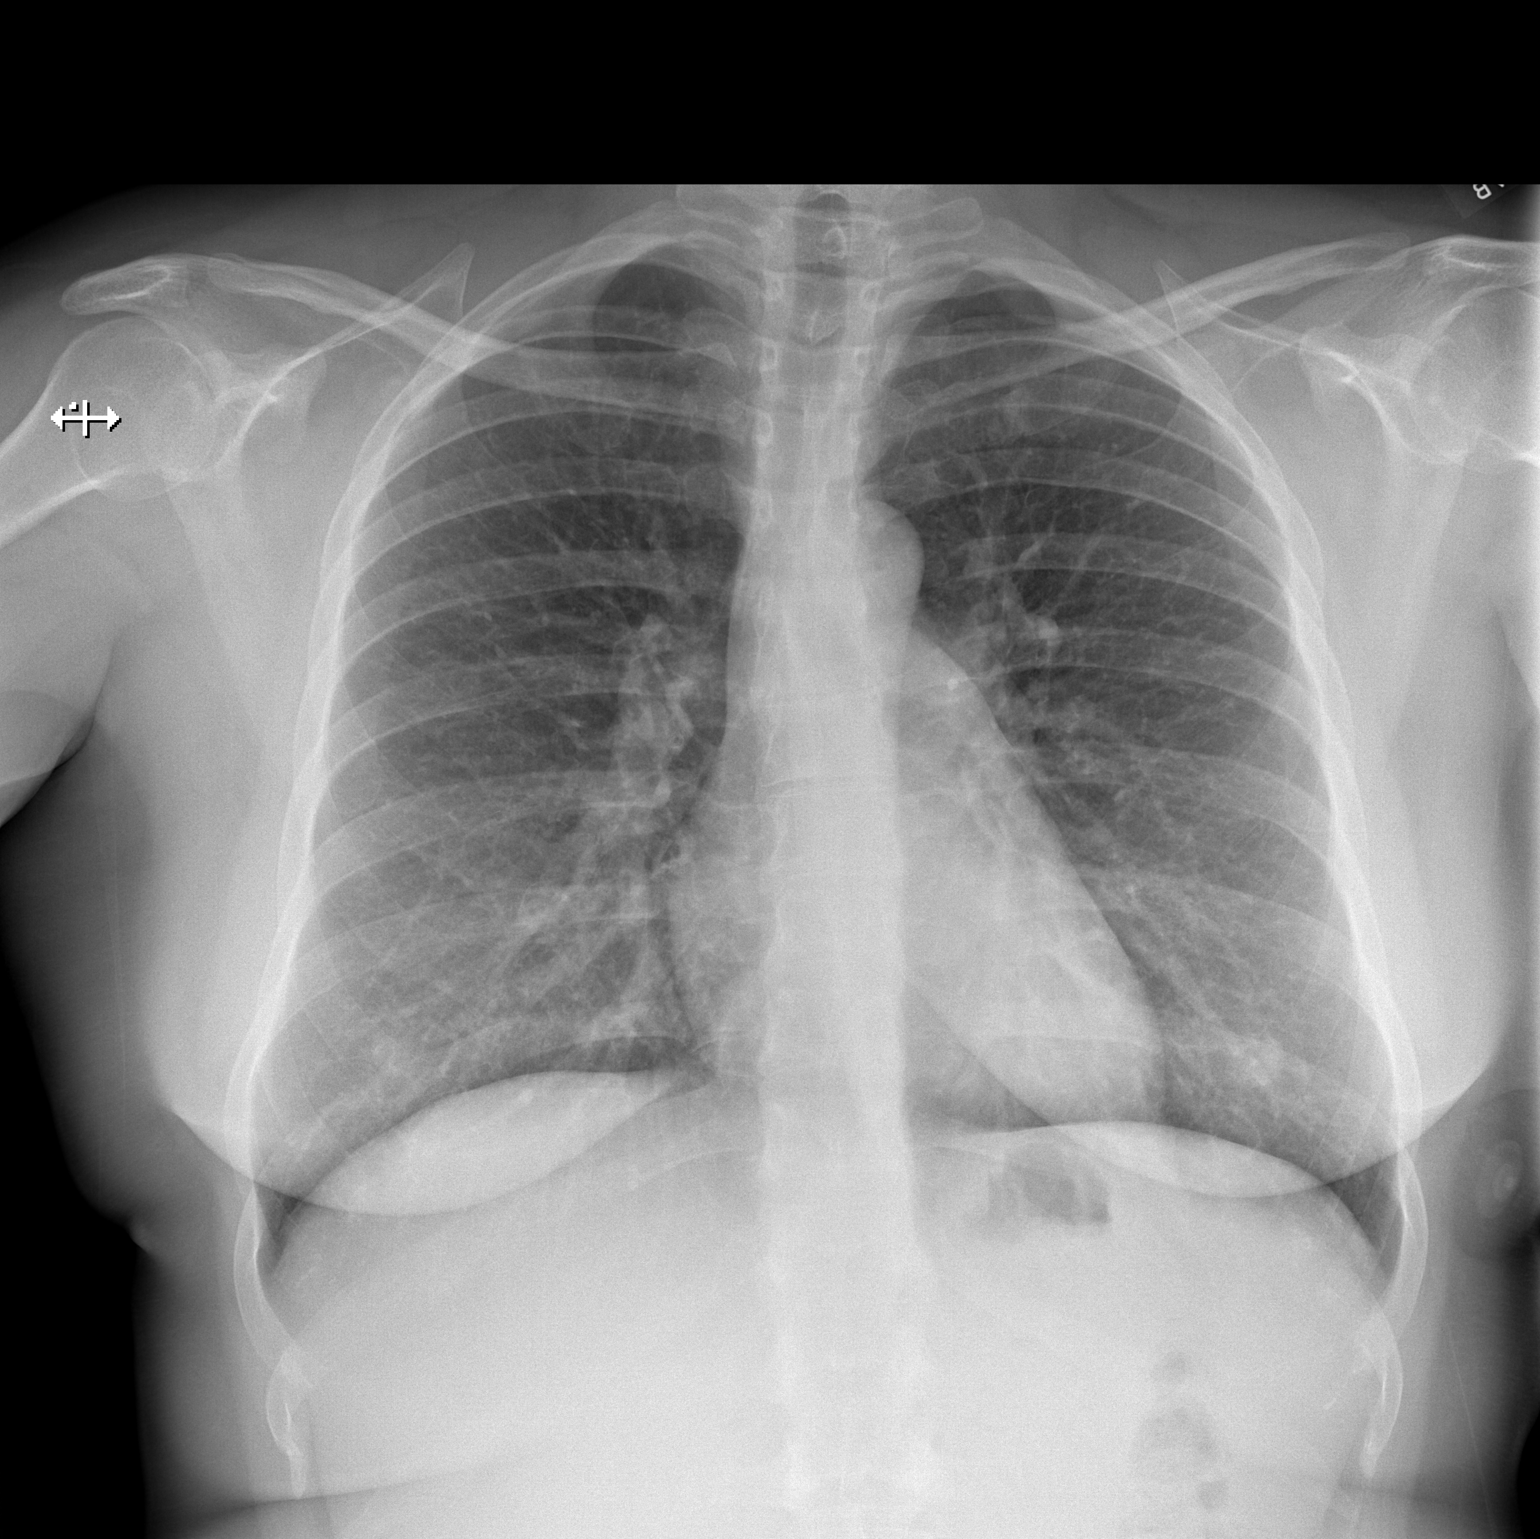

[w chest lat]
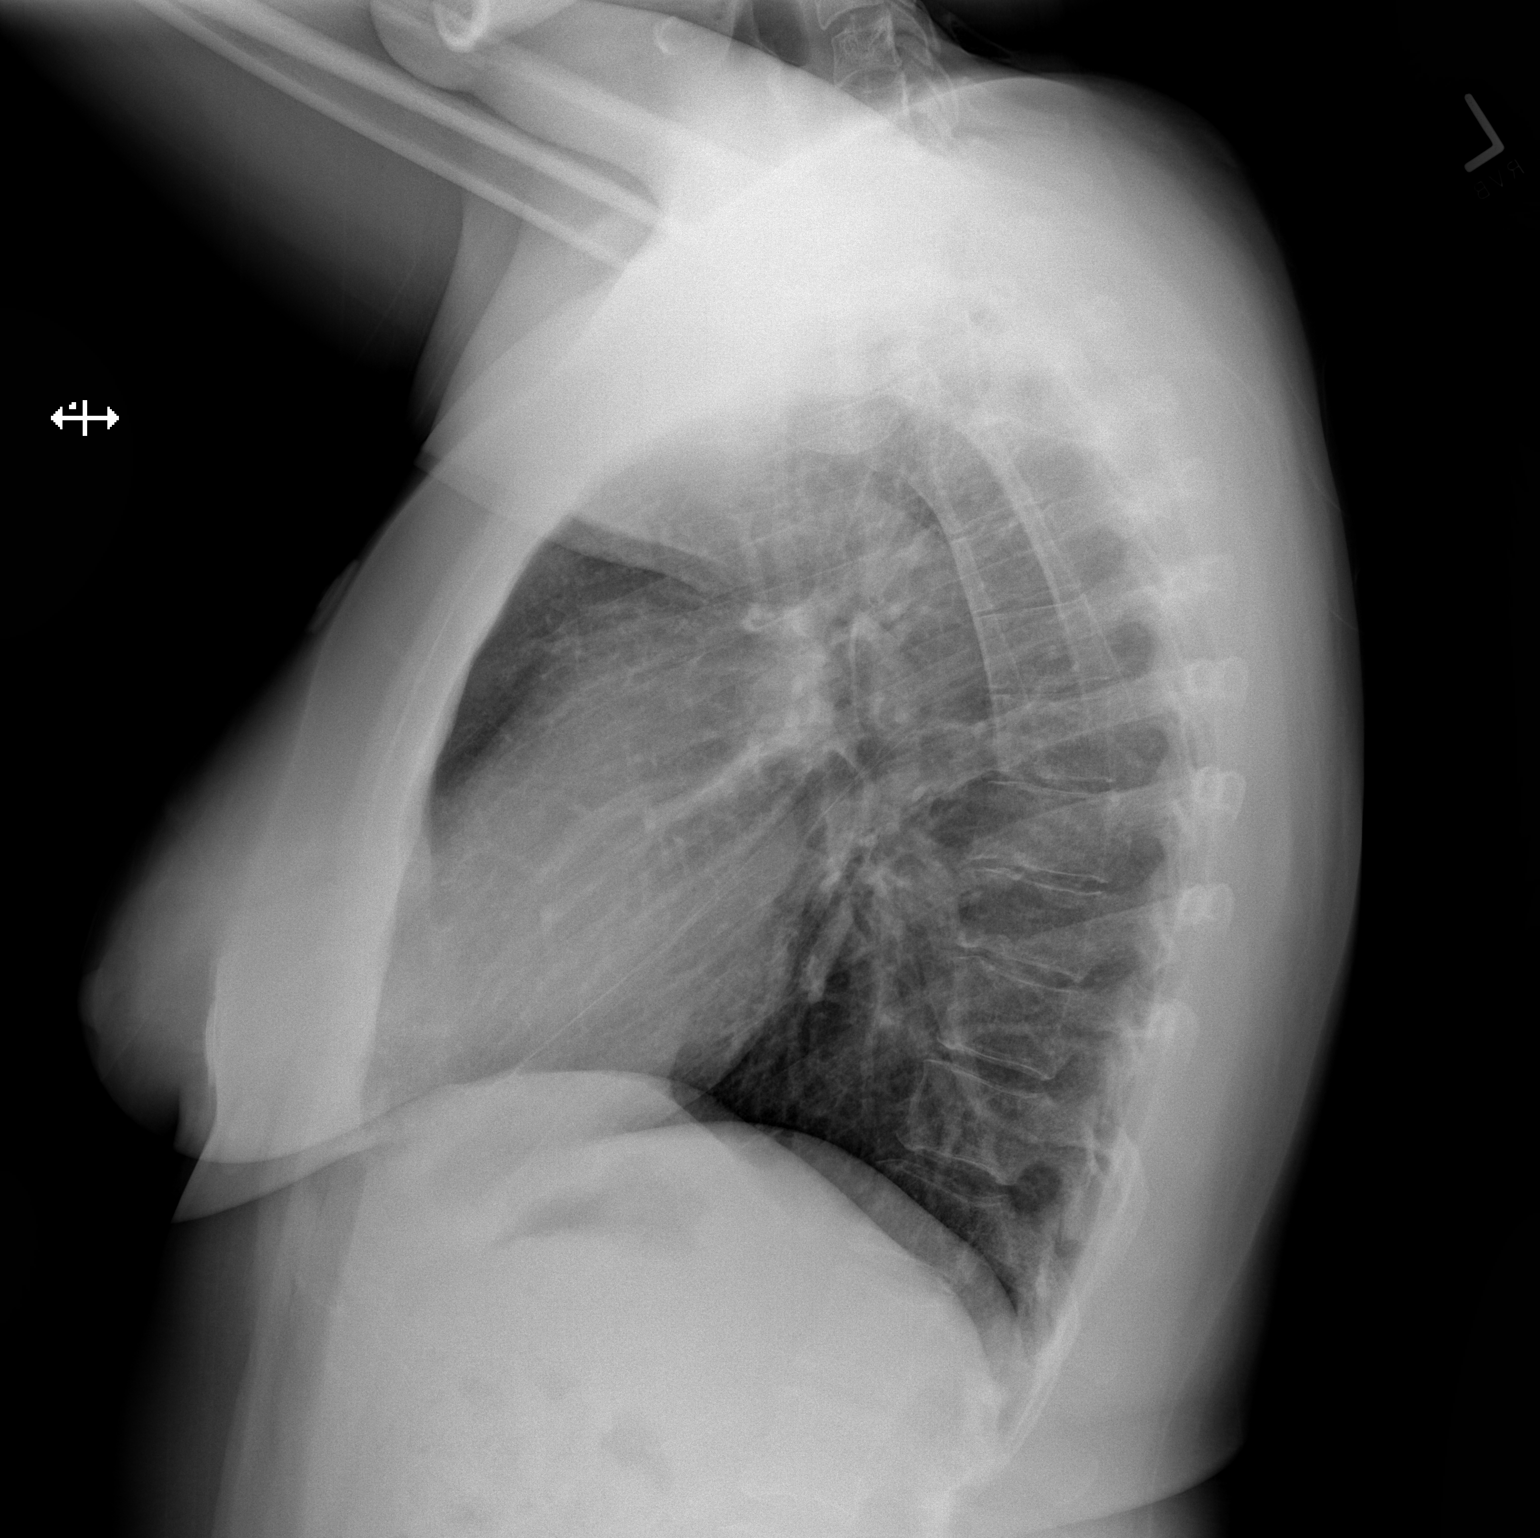

[2 of 2 positions shown; findings below may reference images not displayed]

FINDINGS: The heart size and mediastinal contours are within normal limits.
Both lungs are clear. The visualized skeletal structures are
unremarkable.
IMPRESSION: No active cardiopulmonary disease.

## 2018-03-10 ENCOUNTER — Ambulatory Visit (INDEPENDENT_AMBULATORY_CARE_PROVIDER_SITE_OTHER): Payer: Managed Care, Other (non HMO)

## 2018-03-10 ENCOUNTER — Other Ambulatory Visit: Payer: Self-pay

## 2018-03-10 ENCOUNTER — Ambulatory Visit: Payer: Managed Care, Other (non HMO) | Admitting: Sports Medicine

## 2018-03-10 ENCOUNTER — Encounter: Payer: Self-pay | Admitting: Sports Medicine

## 2018-03-10 VITALS — BP 122/68 | HR 66 | Resp 16 | Ht 65.0 in | Wt 177.0 lb

## 2018-03-10 DIAGNOSIS — M25571 Pain in right ankle and joints of right foot: Secondary | ICD-10-CM

## 2018-03-10 DIAGNOSIS — S93401A Sprain of unspecified ligament of right ankle, initial encounter: Secondary | ICD-10-CM | POA: Diagnosis not present

## 2018-03-10 NOTE — Progress Notes (Signed)
Subjective: Yesenia [name] is a 37 y.o. female patient who presents to office for evaluation of right foot and ankle pain. Patient complains of continued pain in the ankle x 2.5 weeks after rolling getting off thread mill. Patient has tried rest, aleve, CAM boot with some relief in symptoms. Patient denies any other pedal complaints. Pain 4-7/10 but feels good today in tennis shoe with no major discomfort.  Review of Systems  All other systems reviewed and are negative.    Patient Active Problem List   Diagnosis Date Noted  . SVD (spontaneous vaginal delivery) 06/13/2017  . Liveborn infant 06/13/2017  . Indication for care in labor or delivery 06/12/2017  . 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 Patellofemoral stress syndrome of both knees 02/23/2015    Current Outpatient Medications on File Prior to Visit  Medication Sig Dispense Refill  . ibuprofen (ADVIL,MOTRIN) 600 MG tablet Take 1 tablet (600 mg total) by mouth every 6 (six) hours. 30 tablet 0  . Prenatal Vit-Fe Fumarate-FA (PRENATAL MULTIVITAMIN) TABS tablet Take 1 tablet by mouth daily at 12 noon.     No current facility-administered medications on file prior to visit.     No Known Allergies  Objective:  General: Alert and oriented x3 in no acute distress  Dermatology: No open lesions bilateral lower extremities, no webspace macerations, no ecchymosis bilateral, all nails x 10 are well manicured.  Vascular: Dorsalis Pedis and Posterior Tibial pedal pulses palpable, Capillary Fill Time 3 seconds,(+) pedal hair growth bilateral, no edema bilateral lower extremities, Temperature gradient within normal limits.  Neurology: Michaell Cowing sensation intact via light touch bilateral, (- )Tinels sign bilateral.   Musculoskeletal: Minimal tenderness with palpation at lateral ankle. Negative talar tilt, Negative tib-fib stress, No instability. No pain with calf compression bilateral. Range of motion within normal limits with no  guarding on right ankle. Strength within normal limits in all groups bilateral. 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   Gait: Minimally Antalgic gait  Xrays  Right foot/ Ankle   Impression: Normal osseous mineralization, no fracture or dislocation, mild varus 5th hammertoe, no other acute findings.   Assessment and Plan: Problem List Items Addressed This Visit    None    Visit Diagnoses    Right ankle pain, unspecified chronicity    -  Primary   Relevant Orders   DG Ankle Complete Right   Sprain of right ankle, unspecified ligament, initial encounter           -Complete examination performed -Xrays reviewed -Discussed treatement options for low grade sprain  -Rx Ankle gaunlet and recommend PRICE protocol and OTC anti-inflammatories PRN -Avoid uneven surfaces and working out for at least 1 more week until symptoms are improved completely  -Patient to return to office as needed or sooner if condition worsens.  Asencion Islam, DPM

## 2018-06-06 ENCOUNTER — Encounter: Payer: Self-pay | Admitting: Emergency Medicine

## 2018-06-06 ENCOUNTER — Emergency Department (HOSPITAL_COMMUNITY)
Admission: EM | Admit: 2018-06-06 | Discharge: 2018-06-07 | Disposition: A | Discharge status: Home / Self Care | Payer: 59 | Attending: Emergency Medicine | Admitting: Emergency Medicine

## 2018-06-06 DIAGNOSIS — M5412 Radiculopathy, cervical region: Secondary | ICD-10-CM | POA: Insufficient documentation

## 2018-06-06 DIAGNOSIS — M542 Cervicalgia: Secondary | ICD-10-CM | POA: Diagnosis present

## 2018-06-06 DIAGNOSIS — J45909 Unspecified asthma, uncomplicated: Secondary | ICD-10-CM | POA: Insufficient documentation

## 2018-06-06 NOTE — ED Triage Notes (Signed)
Pt complains of neck and shoulder pain for two days, was seen at Urgent Care and received robaxin, mobic and prednisone with no relief

## 2018-06-07 MED ORDER — HYDROCODONE-ACETAMINOPHEN 5-325 MG PO TABS
1.0000 | ORAL_TABLET | Freq: Once | ORAL | Status: AC
  Administered 2018-06-07: 1 via ORAL
  Filled 2018-06-07: qty 1

## 2018-06-07 MED ORDER — HYDROCODONE-ACETAMINOPHEN 5-325 MG PO TABS: 1.0000 | ORAL_TABLET | Freq: Four times a day (QID) | ORAL | 0 refills | Status: DC | PRN

## 2018-06-07 NOTE — ED Provider Notes (Signed)
[facility]-EMERGENCY DEPT Provider Note: Lowella Dell, MD, FACEP  CSN: [id number] MRN: [id number] ARRIVAL: 06/06/18 at 2119 ROOM: WA21/[facility]21   CHIEF COMPLAINT  Neck Pain and Shoulder Pain   HISTORY OF PRESENT ILLNESS  06/07/18 12:16 AM Yesenia [name] is a 38 y.o. female who woke up Sunday morning (4 days ago) with pain on the left side of her neck radiating to her left shoulder and left upper back.  It also radiates down her left arm in about the C7 dermatome.  She rates her pain as a 7 out of 10 at rest and worse when she moves her neck, particularly posterior flexion.  She denies associated numbness or weakness.  She was seen at an urgent care recently and was given Robaxin, Mobic and prednisone which has not relieved her pain.  She denies specific injury.   Past Medical History:  Diagnosis Date  . Asthma   . SVD (spontaneous vaginal delivery) 06/13/2017    History reviewed. No pertinent surgical history.  Family History  Problem Relation Age of Onset  . Hypertension Mother   . Diabetes Father   . Hypertension Father   . Kidney disease Father     Social History   Tobacco Use  . Smoking status: Never Smoker  . Smokeless tobacco: Never Used  Substance Use Topics  . Alcohol use: No  . Drug use: No    Prior to Admission medications   Medication Sig Start Date End Date Taking? Authorizing Provider  ibuprofen (ADVIL,MOTRIN) 600 MG tablet Take 1 tablet (600 mg total) by mouth every 6 (six) hours. 06/14/17   Meisinger, Tawanna Cooler, MD  NORLYDA 0.35 MG tablet  03/09/18   [provider]  Prenatal Vit-Fe Fumarate-FA (PRENATAL MULTIVITAMIN) TABS tablet Take 1 tablet by mouth daily at 12 noon.    [provider]    Allergies Patient has no known allergies.   REVIEW OF SYSTEMS  Negative except as noted here or in the History of Present Illness.   PHYSICAL EXAMINATION  Initial Vital Signs Blood pressure (!) 146/92, pulse 72, temperature 98 F (36.7 C), temperature  source Oral, resp. rate 16, last menstrual period 06/06/2018, SpO2 99 %, unknown if currently breastfeeding.  Examination General: Well-developed, well-nourished female in no acute distress; appearance consistent with age of record HENT: normocephalic; atraumatic Eyes: pupils equal, round and reactive to light; extraocular muscles intact Neck: supple; certain movements reproduce pain Heart: regular rate and rhythm Lungs: clear to auscultation bilaterally Abdomen: soft; nondistended; nontender; bowel sounds present Extremities: No deformity; full range of motion Neurologic: Awake, alert and oriented; motor function intact in all extremities and symmetric; sensation intact in upper extremities and symmetric; no facial droop Skin: Warm and dry Psychiatric: Flat affect   RESULTS  Summary of this visit's results, reviewed by myself:   EKG Interpretation  Date/Time:    Ventricular Rate:    PR Interval:    QRS Duration:   QT Interval:    QTC Calculation:   R Axis:     Text Interpretation:        Laboratory Studies: No results found for this or any previous visit (from the past 24 hour(s)). Imaging Studies: No results found.  ED COURSE and MDM  Nursing notes and initial vitals signs, including pulse oximetry, reviewed.  Vitals:   06/06/18 2131  BP: (!) 146/92  Pulse: 72  Resp: 16  Temp: 98 F (36.7 C)  TempSrc: Oral  SpO2: 99%   Will treat with a short course  of narcotics.  Patient is narcotic nave.  Will refer to orthopedics.  PROCEDURES    ED DIAGNOSES     ICD-10-CM   1. Cervical radiculopathy at C7 M54.12        Yesenia [name], Jonny RuizJohn, MD 06/07/18 [phone]

## 2019-08-08 ENCOUNTER — Encounter (HOSPITAL_COMMUNITY): Payer: Self-pay | Admitting: Emergency Medicine

## 2019-08-08 ENCOUNTER — Emergency Department (HOSPITAL_COMMUNITY)
Admission: EM | Admit: 2019-08-08 | Discharge: 2019-08-08 | Disposition: A | Discharge status: Home / Self Care | Payer: 59 | Attending: Emergency Medicine | Admitting: Emergency Medicine

## 2019-08-08 ENCOUNTER — Other Ambulatory Visit: Payer: Self-pay

## 2019-08-08 DIAGNOSIS — R519 Headache, unspecified: Secondary | ICD-10-CM

## 2019-08-08 DIAGNOSIS — G43909 Migraine, unspecified, not intractable, without status migrainosus: Secondary | ICD-10-CM | POA: Diagnosis present

## 2019-08-08 HISTORY — DX: Migraine, unspecified, not intractable, without status migrainosus: G43.909

## 2019-08-08 MED ORDER — DIPHENHYDRAMINE HCL 50 MG/ML IJ SOLN
50.0000 mg | Freq: Once | INTRAMUSCULAR | Status: AC
  Administered 2019-08-08: 10:00:00 50 mg via INTRAVENOUS
  Filled 2019-08-08: qty 1

## 2019-08-08 MED ORDER — SODIUM CHLORIDE 0.9 % IV BOLUS
1000.0000 mL | Freq: Once | INTRAVENOUS | Status: AC
  Administered 2019-08-08: 1000 mL via INTRAVENOUS

## 2019-08-08 MED ORDER — PROCHLORPERAZINE MALEATE 10 MG PO TABS: 10.0000 mg | ORAL_TABLET | Freq: Two times a day (BID) | ORAL | 0 refills | Status: DC | PRN

## 2019-08-08 MED ORDER — PROCHLORPERAZINE EDISYLATE 10 MG/2ML IJ SOLN
10.0000 mg | Freq: Once | INTRAMUSCULAR | Status: AC
  Administered 2019-08-08: 10:00:00 10 mg via INTRAVENOUS
  Filled 2019-08-08: qty 2

## 2019-08-08 NOTE — ED Provider Notes (Signed)
Yesenia [name]   CSN: [id number] Arrival date & time: 08/08/19  0800     History Chief Complaint  Patient presents with  . Migraine    Yesenia [name] is a 39 y.o. female.  The history is provided by the patient and medical records. No language interpreter was used.  Migraine This is a new problem. The current episode started 2 days ago. The problem occurs constantly. The problem has not changed since onset.Associated symptoms include headaches. Pertinent negatives include no chest pain, no abdominal pain and no shortness of breath. Nothing (bright lights) aggravates the symptoms. Nothing relieves the symptoms. She has tried nothing for the symptoms. The treatment provided no relief.       Past Medical History:  Diagnosis Date  . Asthma   . Migraine   . SVD (spontaneous vaginal delivery) 06/13/2017    Patient Active Problem List   Diagnosis Date Noted  . SVD (spontaneous vaginal delivery) 06/13/2017  . Liveborn infant 06/13/2017  . Indication for care in labor or delivery 06/12/2017  . 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 Patellofemoral stress syndrome of both knees 02/23/2015    History reviewed. No pertinent surgical history.   OB History    Gravida  3   Para  2   Term  2   Preterm      AB      Living  2     SAB      TAB      Ectopic      Multiple      Live Births  2           Family History  Problem Relation Age of Onset  . Hypertension Mother   . Diabetes Father   . Hypertension Father   . Kidney disease Father     Social History   Tobacco Use  . Smoking status: Never Smoker  . Smokeless tobacco: Never Used  Substance Use Topics  . Alcohol use: No  . Drug use: No    Home Medications Prior to Admission medications   Medication Sig Start Date End Date Taking? Authorizing Provider  HYDROcodone-acetaminophen (NORCO) 5-325 MG tablet Take 1 tablet by mouth every 6 (six) hours  as needed (for pain). 06/07/18   Molpus, John, MD  ibuprofen (ADVIL,MOTRIN) 600 MG tablet Take 1 tablet (600 mg total) by mouth every 6 (six) hours. 06/14/17   Meisinger, Tawanna Cooler, MD  NORLYDA 0.35 MG tablet  03/09/18   [provider]  Prenatal Vit-Fe Fumarate-FA (PRENATAL MULTIVITAMIN) TABS tablet Take 1 tablet by mouth daily at 12 noon.    [provider]    Allergies    Patient has no known allergies.  Review of Systems   Review of Systems  Constitutional: Negative for chills, diaphoresis, fatigue and fever.  HENT: Negative for congestion.   Eyes: Positive for photophobia and visual disturbance.  Respiratory: Negative for cough, chest tightness, shortness of breath and wheezing.   Cardiovascular: Negative for chest pain, palpitations and leg swelling.  Gastrointestinal: Negative for abdominal pain, constipation, diarrhea, nausea and vomiting.  Genitourinary: Negative for dysuria.  Musculoskeletal: Negative for back pain, neck pain and neck stiffness.  Skin: Negative for rash.  Neurological: Positive for headaches. Negative for dizziness, tremors, seizures, facial asymmetry, speech difficulty, weakness, light-headedness and numbness.  Psychiatric/Behavioral: Negative for agitation and confusion.  All other systems reviewed and are negative.   Physical Exam Updated Vital Signs BP 130/78 (BP Location: Right  Arm)   Pulse 85   Temp 98.7 F (37.1 C) (Oral)   Resp 18   Ht 5\' 5"  (1.651 m)   Wt 89.1 kg   LMP 07/30/2019   SpO2 98%   BMI 32.68 kg/m   Physical Exam Vitals and nursing [name] reviewed.  Constitutional:      General: She is not in acute distress.    Appearance: Normal appearance. She is well-developed. She is not ill-appearing, toxic-appearing or diaphoretic.  HENT:     Head: Normocephalic and atraumatic.     Right Ear: External ear normal.     Left Ear: External ear normal.     Nose: Nose normal. No congestion.     Mouth/Throat:     Mouth: Mucous  membranes are moist.     Pharynx: No oropharyngeal exudate or posterior oropharyngeal erythema.  Eyes:     Extraocular Movements: Extraocular movements intact.     Conjunctiva/sclera: Conjunctivae normal.     Pupils: Pupils are equal, round, and reactive to light.  Cardiovascular:     Rate and Rhythm: Normal rate.     Pulses: Normal pulses.     Heart sounds: No murmur.  Pulmonary:     Effort: Pulmonary effort is normal. No respiratory distress.     Breath sounds: No stridor. No wheezing, rhonchi or rales.  Chest:     Chest wall: No tenderness.  Abdominal:     General: Abdomen is flat. There is no distension.     Tenderness: There is no abdominal tenderness. There is no right CVA tenderness, left CVA tenderness or rebound.  Musculoskeletal:        General: No tenderness.     Cervical back: Normal range of motion and neck supple. No tenderness.  Skin:    General: Skin is warm.     Capillary Refill: Capillary refill takes less than 2 seconds.     Coloration: Skin is not pale.     Findings: No erythema or rash.  Neurological:     General: No focal deficit present.     Mental Status: She is alert and oriented to person, place, and time.     Cranial Nerves: No cranial nerve deficit.     Sensory: No sensory deficit.     Motor: No weakness or abnormal muscle tone.     Coordination: Coordination normal.     Gait: Gait normal.     Deep Tendon Reflexes: Reflexes are normal and symmetric.  Psychiatric:        Mood and Affect: Mood normal.     ED Results / Procedures / Treatments   Labs (all labs ordered are listed, but only abnormal results are displayed) Labs Reviewed - No data to display  EKG None  Radiology No results found.  Procedures Procedures (including critical care time)  Medications Ordered in ED Medications - No data to display  ED Course  I have reviewed the triage vital signs and the nursing notes.  Pertinent labs & imaging results that were available  during my care of the patient were reviewed by me and considered in my medical decision making (see chart for details).    MDM Rules/Calculators/A&P                      Yesenia [name] is a 39 y.o. female with a long past medical history significant for migraines and asthma who presents with headache. She reports that for the last 3 days, she has been having  headaches that have not responded to her home Tylenol and ibuprofen. She says that her family had a GI bug last week and she had what she felt a low-grade fever several days ago. She reports that her headache is similar to severe migraine she has had years ago and it is across her head rating down towards the back of her head and neck area. She denies any trauma. She describes the headache as 9 of 10 in severity. She reports vision changes and tingling in her extremities that fit with prior migraines. She denies nausea or vomiting at this time. She denies any neck stiffness. She denies any chest pain, shortness of breath, cough, abdominal pain, constipation, or diarrhea. She reports her family all had vomiting and diarrhea but she did not have all this other than some nausea and decreased appetite. She denies any other complaints on arrival. She reports severe photophobia and phonophobia.  On exam, lungs are clear and chest is nontender. Neck is nontender. Abdomen is nontender. She is moving all extremities. Normal gait and normal finger-nose-finger testing bilaterally. Normal sensation and strength in extremities. Pupils are symmetric and reactive with normal extraocular movements. Symmetric smile. Clear speech. Patient very photophobic on exam. No neck stiffness.  We had a shared decision made conversation with patient and given her description of symptoms, her exam, and similar to prior, I suspect this is a migraine. We agreed to hold on any imaging or labs at this time and instead give her headache cocktail to see if this helps her symptoms. If  they do not resolve, would consider further work-up however at this time do not feel she needs any lumbar puncture or other work-up. Patient does not understand any coronavirus test at this time given her lack of respiratory symptoms and lack of any family having respiratory symptoms.  Anticipate reassessment after medications.  11:42 AM Patient reports she is now a 2 or 3 out of 10 in severity and much improved.  She was able to get fluids down and is feeling much better.  She would like to go home.  On reassessment, we agree that we have a low suspicion for more nefarious etiology of her headache and do not feel she needs imaging, labs, or LP.  Patient will be given prescription for Compazine which she requested and have her follow-up with her PCP.  She had no other questions or concerns and was discharged in good condition with improved symptoms.   Final Clinical Impression(s) / ED Diagnoses Final diagnoses:  Bad headache  Migraine without status migrainosus, not intractable, unspecified migraine type    Rx / DC Orders ED Discharge Orders         Ordered    prochlorperazine (COMPAZINE) 10 MG tablet  2 times daily PRN     08/08/19 1143         Clinical Impression: 1. Bad headache   2. Migraine without status migrainosus, not intractable, unspecified migraine type     Disposition: Discharge  Condition: Good  I have discussed the results, Dx and Tx plan with the pt(& family if present). He/she/they expressed understanding and agree(s) with the plan. Discharge instructions discussed at great length. Strict return precautions discussed and pt &/or family have verbalized understanding of the instructions. No further questions at time of discharge.    New Prescriptions   PROCHLORPERAZINE (COMPAZINE) 10 MG TABLET    Take 1 tablet (10 mg total) by mouth 2 (two) times daily as needed for nausea or  vomiting.    Follow Up: [facility] [address]  [place] [address] [phone] Schedule an appointment as soon as possible for a visit    [facility] [address] [id number] [address]c [place] [address] [phone]       Macallister Ashmead, Canary Brim, MD 08/08/19 1144

## 2019-08-08 NOTE — Discharge Instructions (Signed)
Your history and exam today are consistent with a migrainous type headache causing your discomfort.  This was likely triggered by your recent likely viral GI infection as well as dehydration.  As you were feeling much better, we agreed together to hold on further work-up and let you go home to finish resting.  Please stay hydrated and use the nausea/headache medication to up with your symptoms.  If any symptoms change or worsen, please return to the nearest emergency room.  Please follow-up with a PCP.

## 2019-08-08 NOTE — ED Triage Notes (Signed)
Pt c/o migraine that started yesterday. Not relieved with OTC migraine medications.

## 2019-08-08 NOTE — ED Notes (Signed)
Patient ambulated to RR w/o assistance.  

## 2021-07-02 ENCOUNTER — Ambulatory Visit: Payer: 59 | Admitting: Family

## 2021-07-02 ENCOUNTER — Ambulatory Visit: Payer: Managed Care, Other (non HMO) | Admitting: Nurse Practitioner

## 2021-09-01 ENCOUNTER — Encounter (HOSPITAL_BASED_OUTPATIENT_CLINIC_OR_DEPARTMENT_OTHER): Payer: Self-pay | Admitting: Obstetrics and Gynecology

## 2021-09-01 DIAGNOSIS — N939 Abnormal uterine and vaginal bleeding, unspecified: Secondary | ICD-10-CM | POA: Diagnosis present

## 2021-09-01 HISTORY — DX: Abnormal uterine and vaginal bleeding, unspecified: N93.9

## 2021-09-06 ENCOUNTER — Encounter (HOSPITAL_BASED_OUTPATIENT_CLINIC_OR_DEPARTMENT_OTHER): Payer: Self-pay | Admitting: Obstetrics and Gynecology

## 2021-09-06 ENCOUNTER — Other Ambulatory Visit: Payer: Self-pay

## 2021-09-06 NOTE — Progress Notes (Signed)
Spoke w/ via phone for pre-op interview---pt  ?Lab needs dos----   CBC, CMP. And UPT            ?Lab results------n/a ?COVID test -----patient states asymptomatic no test needed ?Arrive at -------0530, 09/10/2021 ? ?NPO after MN NO Solid Food.  Clear liquids from MN until--- ?Med rec completed ?Medications to take morning of surgery -----none ?Diabetic medication -----n/a ?Patient instructed no nail polish to be worn day of surgery ?Patient instructed to bring photo id and insurance card day of surgery ?Patient aware to have Driver (ride ) / caregiver husband Yesenia [name]   for 24 hours after surgery  ?Patient Special Instructions -----none ?Pre-Op special Istructions -----none ?Patient verbalized understanding of instructions that were given at this phone interview. ?Patient denies shortness of breath, chest pain, fever, cough at this phone interview.  ?

## 2021-09-07 NOTE — H&P (Signed)
Yesenia [name] is an 41 y.o. female G3P3 with AUB, benign EMB.  Ultrasound suggestive of polyp.  D/W pt r/b/a of surgery - also process and expectations.   ? ?Pertinent Gynecological History: ?+ h/o abn pap, last 2022 WNL HR HPV neg ?No STDs ?MMG UTD ? ?G3P3 SVD x 3, no complications ? ?Menstrual History: ?Patient's last menstrual period was 08/27/2021 (exact date). ?  ? ?Past Medical History:  ?Diagnosis Date  ? Abnormal uterine bleeding (AUB) 09/01/2021  ? Asthma   ? COVID   ? nov 2022 high fever, fatigue, cough X 6 days  ? Migraine   ? SVD (spontaneous vaginal delivery) 06/13/2017  ? Wears glasses   ? ? ?Past Surgical History:  ?Procedure Laterality Date  ? NO PAST SURGERIES    ? ? ?Family History  ?Problem Relation Age of Onset  ? Hypertension Mother   ? Diabetes Father   ? Hypertension Father   ? Kidney disease Father   ? ? ?Social History:  reports that she has never smoked. She has never used smokeless tobacco. She reports that she does not drink alcohol and does not use drugs. married ? ?Allergies: No Known Allergies ? ?No medications prior to admission.  ? ? ?Review of Systems  ?Constitutional: Negative.   ?HENT: Negative.    ?Respiratory: Negative.    ?Cardiovascular: Negative.   ?Gastrointestinal: Negative.   ?Genitourinary:  Positive for menstrual problem.  ?Musculoskeletal: Negative.   ?Skin: Negative.   ?Neurological: Negative.   ?Psychiatric/Behavioral: Negative.    ? ?Height 5\' 5"  (1.651 m), weight 93 kg, last menstrual period 08/27/2021, unknown if currently breastfeeding. ?Physical Exam ?Constitutional:   ?   Appearance: Normal appearance.  ?HENT:  ?   Head: Normocephalic and atraumatic.  ?Cardiovascular:  ?   Rate and Rhythm: Normal rate and regular rhythm.  ?Pulmonary:  ?   Effort: Pulmonary effort is normal.  ?   Breath sounds: Normal breath sounds.  ?Abdominal:  ?   General: Bowel sounds are normal.  ?   Palpations: Abdomen is soft.  ?Genitourinary: ?   General: Normal vulva.  ?   Rectum:  Normal.  ?Musculoskeletal:     ?   General: Normal range of motion.  ?   Cervical back: Normal range of motion and neck supple.  ?Skin: ?   General: Skin is warm and dry.  ?Neurological:  ?   General: No focal deficit present.  ?   Mental Status: She is alert and oriented to person, place, and time.  ?Psychiatric:     ?   Mood and Affect: Mood normal.     ?   Behavior: Behavior normal.  ? ?Thickened heterogenous endometrium on [place] ? ?Assessment/Plan: ?41yo G3P3 with AUB, [place] suggestive of polyp ?D/w pt r/b/a of surgery - including process and expectations ?Will proceed ? ?Jailee Jaquez Bovard-Stuckert ?09/07/2021, 10:53 AM ? ?

## 2021-09-09 NOTE — Anesthesia Preprocedure Evaluation (Addendum)
Anesthesia Evaluation  ?Patient identified by MRN, date of birth, ID band ?Patient awake ? ? ? ?Reviewed: ?Allergy & Precautions, NPO status , Patient's Chart, lab work & pertinent test results ? ?History of Anesthesia Complications ?Negative for: history of anesthetic complications ? ?Airway ?Mallampati: II ? ?TM Distance: >3 FB ?Neck ROM: Full ? ? ? Dental ? ?(+) Dental Advisory Given, Teeth Intact ?  ?Pulmonary ?asthma ,  ?  ?Pulmonary exam normal ? ? ? ? ? ? ? Cardiovascular ?negative cardio ROS ?Normal cardiovascular exam ? ? ?  ?Neuro/Psych ? Headaches, negative psych ROS  ? GI/Hepatic ?negative GI ROS, Neg liver ROS,   ?Endo/Other  ? ?Obesity ? ? Renal/GU ?negative Renal ROS  ? ?  ?Musculoskeletal ? ?(+) Arthritis ,  ? Abdominal ?  ?Peds ? Hematology ?negative hematology ROS ?(+)   ?Anesthesia Other Findings ? ? Reproductive/Obstetrics ? ?  ? ? ? ? ? ? ? ? ? ? ? ? ? ?  ?  ? ? ? ? ? ? ? ?Anesthesia Physical ?Anesthesia Plan ? ?ASA: 2 ? ?Anesthesia Plan: General  ? ?Post-op Pain Management: Tylenol PO (pre-op)* and Celebrex PO (pre-op)*  ? ?Induction: Intravenous ? ?PONV Risk Score and Plan: 3 and Treatment may vary due to age or medical condition, Ondansetron, Dexamethasone and Midazolam ? ?Airway Management Planned: LMA ? ?Additional Equipment: None ? ?Intra-op Plan:  ? ?Post-operative Plan: Extubation in OR ? ?Informed Consent: I have reviewed the patients History and Physical, chart, labs and discussed the procedure including the risks, benefits and alternatives for the proposed anesthesia with the patient or authorized representative who has indicated his/her understanding and acceptance.  ? ? ? ?Dental advisory given ? ?Plan Discussed with: CRNA and Anesthesiologist ? ?Anesthesia Plan Comments:   ? ? ? ? ? ?Anesthesia Quick Evaluation ? ?

## 2021-09-10 ENCOUNTER — Ambulatory Visit (HOSPITAL_BASED_OUTPATIENT_CLINIC_OR_DEPARTMENT_OTHER)
Admission: RE | Admit: 2021-09-10 | Discharge: 2021-09-10 | Disposition: A | Discharge status: Home / Self Care | Payer: No Typology Code available for payment source | Attending: Obstetrics and Gynecology | Admitting: Obstetrics and Gynecology

## 2021-09-10 ENCOUNTER — Other Ambulatory Visit: Payer: Self-pay

## 2021-09-10 ENCOUNTER — Encounter (HOSPITAL_BASED_OUTPATIENT_CLINIC_OR_DEPARTMENT_OTHER)
Admission: RE | Disposition: A | Discharge status: Home / Self Care | Payer: Self-pay | Source: Home / Self Care | Attending: Obstetrics and Gynecology

## 2021-09-10 ENCOUNTER — Ambulatory Visit (HOSPITAL_BASED_OUTPATIENT_CLINIC_OR_DEPARTMENT_OTHER): Payer: No Typology Code available for payment source | Admitting: Anesthesiology

## 2021-09-10 ENCOUNTER — Encounter (HOSPITAL_BASED_OUTPATIENT_CLINIC_OR_DEPARTMENT_OTHER): Payer: Self-pay | Admitting: Obstetrics and Gynecology

## 2021-09-10 DIAGNOSIS — J45909 Unspecified asthma, uncomplicated: Secondary | ICD-10-CM | POA: Diagnosis not present

## 2021-09-10 DIAGNOSIS — E669 Obesity, unspecified: Secondary | ICD-10-CM | POA: Insufficient documentation

## 2021-09-10 DIAGNOSIS — M1990 Unspecified osteoarthritis, unspecified site: Secondary | ICD-10-CM | POA: Diagnosis not present

## 2021-09-10 DIAGNOSIS — Z6835 Body mass index (BMI) 35.0-35.9, adult: Secondary | ICD-10-CM | POA: Insufficient documentation

## 2021-09-10 DIAGNOSIS — N840 Polyp of corpus uteri: Secondary | ICD-10-CM | POA: Diagnosis not present

## 2021-09-10 DIAGNOSIS — N939 Abnormal uterine and vaginal bleeding, unspecified: Secondary | ICD-10-CM | POA: Diagnosis present

## 2021-09-10 DIAGNOSIS — Z8616 Personal history of COVID-19: Secondary | ICD-10-CM | POA: Insufficient documentation

## 2021-09-10 HISTORY — DX: Presence of spectacles and contact lenses: Z97.3

## 2021-09-10 HISTORY — DX: COVID-19: U07.1

## 2021-09-10 HISTORY — PX: DILATATION & CURETTAGE/HYSTEROSCOPY WITH MYOSURE: SHX6511

## 2021-09-10 LAB — POCT PREGNANCY, URINE: Preg Test, Ur: NEGATIVE

## 2021-09-10 SURGERY — DILATATION & CURETTAGE/HYSTEROSCOPY WITH MYOSURE
Anesthesia: General | Site: Uterus

## 2021-09-10 MED ORDER — PROPOFOL 10 MG/ML IV BOLUS
INTRAVENOUS | Status: AC
  Filled 2021-09-10: qty 20

## 2021-09-10 MED ORDER — PROPOFOL 10 MG/ML IV BOLUS
INTRAVENOUS | Status: DC | PRN
  Administered 2021-09-10: 200 mg via INTRAVENOUS

## 2021-09-10 MED ORDER — ACETAMINOPHEN 500 MG PO TABS
ORAL_TABLET | ORAL | Status: AC
  Filled 2021-09-10: qty 2

## 2021-09-10 MED ORDER — FENTANYL CITRATE (PF) 100 MCG/2ML IJ SOLN
INTRAMUSCULAR | Status: DC | PRN
  Administered 2021-09-10 (×2): 50 ug via INTRAVENOUS

## 2021-09-10 MED ORDER — MIDAZOLAM HCL 2 MG/2ML IJ SOLN
INTRAMUSCULAR | Status: AC
  Filled 2021-09-10: qty 2

## 2021-09-10 MED ORDER — ONDANSETRON HCL 4 MG/2ML IJ SOLN
INTRAMUSCULAR | Status: DC | PRN
  Administered 2021-09-10: 4 mg via INTRAVENOUS

## 2021-09-10 MED ORDER — LIDOCAINE HCL (CARDIAC) PF 100 MG/5ML IV SOSY
PREFILLED_SYRINGE | INTRAVENOUS | Status: DC | PRN
  Administered 2021-09-10: 80 mg via INTRAVENOUS

## 2021-09-10 MED ORDER — ONDANSETRON HCL 4 MG/2ML IJ SOLN: 4.0000 mg | Freq: Once | INTRAMUSCULAR | Status: DC | PRN

## 2021-09-10 MED ORDER — OXYCODONE HCL 5 MG PO TABS
5.0000 mg | ORAL_TABLET | Freq: Four times a day (QID) | ORAL | 0 refills | Status: AC | PRN
Start: 2021-09-10 — End: ?

## 2021-09-10 MED ORDER — LACTATED RINGERS IV SOLN: INTRAVENOUS | Status: DC

## 2021-09-10 MED ORDER — OXYCODONE HCL 5 MG PO TABS: 5.0000 mg | ORAL_TABLET | Freq: Once | ORAL | Status: DC | PRN

## 2021-09-10 MED ORDER — CELECOXIB 200 MG PO CAPS
ORAL_CAPSULE | ORAL | Status: AC
  Filled 2021-09-10: qty 1

## 2021-09-10 MED ORDER — SODIUM CHLORIDE 0.9 % IR SOLN
Status: DC | PRN
  Administered 2021-09-10: 3000 mL

## 2021-09-10 MED ORDER — CELECOXIB 200 MG PO CAPS
200.0000 mg | ORAL_CAPSULE | Freq: Once | ORAL | Status: AC
  Administered 2021-09-10: 200 mg via ORAL

## 2021-09-10 MED ORDER — IBUPROFEN 800 MG PO TABS: 800.0000 mg | ORAL_TABLET | Freq: Three times a day (TID) | ORAL | 1 refills | Status: AC | PRN

## 2021-09-10 MED ORDER — DEXAMETHASONE SODIUM PHOSPHATE 4 MG/ML IJ SOLN
INTRAMUSCULAR | Status: DC | PRN
  Administered 2021-09-10: 10 mg via INTRAVENOUS

## 2021-09-10 MED ORDER — LIDOCAINE HCL 1 % IJ SOLN
INTRAMUSCULAR | Status: DC | PRN
  Administered 2021-09-10: 10 mL

## 2021-09-10 MED ORDER — ONDANSETRON HCL 4 MG/2ML IJ SOLN
INTRAMUSCULAR | Status: AC
  Filled 2021-09-10: qty 2

## 2021-09-10 MED ORDER — LIDOCAINE HCL (PF) 2 % IJ SOLN
INTRAMUSCULAR | Status: AC
  Filled 2021-09-10: qty 5

## 2021-09-10 MED ORDER — ACETAMINOPHEN 500 MG PO TABS
1000.0000 mg | ORAL_TABLET | ORAL | Status: AC
  Administered 2021-09-10: 1000 mg via ORAL

## 2021-09-10 MED ORDER — POVIDONE-IODINE 10 % EX SWAB: 2.0000 "application " | Freq: Once | CUTANEOUS | Status: DC

## 2021-09-10 MED ORDER — OXYCODONE HCL 5 MG/5ML PO SOLN: 5.0000 mg | Freq: Once | ORAL | Status: DC | PRN

## 2021-09-10 MED ORDER — FENTANYL CITRATE (PF) 100 MCG/2ML IJ SOLN
INTRAMUSCULAR | Status: AC
  Filled 2021-09-10: qty 2

## 2021-09-10 MED ORDER — DEXAMETHASONE SODIUM PHOSPHATE 10 MG/ML IJ SOLN
INTRAMUSCULAR | Status: AC
  Filled 2021-09-10: qty 1

## 2021-09-10 MED ORDER — ACETAMINOPHEN 500 MG PO TABS: 1000.0000 mg | ORAL_TABLET | Freq: Once | ORAL | Status: DC

## 2021-09-10 MED ORDER — MIDAZOLAM HCL 5 MG/5ML IJ SOLN
INTRAMUSCULAR | Status: DC | PRN
  Administered 2021-09-10: 2 mg via INTRAVENOUS

## 2021-09-10 MED ORDER — FENTANYL CITRATE (PF) 100 MCG/2ML IJ SOLN: 25.0000 ug | INTRAMUSCULAR | Status: DC | PRN

## 2021-09-10 SURGICAL SUPPLY — 18 items
CATH ROBINSON RED A/P 16FR (CATHETERS) ×2 IMPLANT
DEVICE MYOSURE LITE (MISCELLANEOUS) ×1 IMPLANT
DEVICE MYOSURE REACH (MISCELLANEOUS) IMPLANT
DILATOR CANAL MILEX (MISCELLANEOUS) IMPLANT
DRSG TELFA 3X8 NADH (GAUZE/BANDAGES/DRESSINGS) ×2 IMPLANT
GAUZE 4X4 16PLY ~~LOC~~+RFID DBL (SPONGE) ×4 IMPLANT
GLOVE BIO SURGEON STRL SZ 6.5 (GLOVE) ×2 IMPLANT
GLOVE BIOGEL PI IND STRL 7.0 (GLOVE) ×2 IMPLANT
GLOVE BIOGEL PI INDICATOR 7.0 (GLOVE) ×2
GOWN STRL REUS W/TWL LRG LVL3 (GOWN DISPOSABLE) ×2 IMPLANT
IV NS IRRIG 3000ML ARTHROMATIC (IV SOLUTION) ×2 IMPLANT
KIT PROCEDURE FLUENT (KITS) ×2 IMPLANT
KIT TURNOVER CYSTO (KITS) ×2 IMPLANT
PACK VAGINAL MINOR WOMEN LF (CUSTOM PROCEDURE TRAY) ×2 IMPLANT
PAD DRESSING TELFA 3X8 NADH (GAUZE/BANDAGES/DRESSINGS) ×1 IMPLANT
PAD OB MATERNITY 4.3X12.25 (PERSONAL CARE ITEMS) ×2 IMPLANT
SEAL CERVICAL OMNI LOK (ABLATOR) IMPLANT
SEAL ROD LENS SCOPE MYOSURE (ABLATOR) ×2 IMPLANT

## 2021-09-10 NOTE — Interval H&P Note (Signed)
History and Physical Interval Note: ? ?09/10/2021 ?7:07 AM ? ?Yesenia [name]  has presented today for surgery, with the diagnosis of polyp pf corpus uteri.  The various methods of treatment have been discussed with the patient and family. After consideration of risks, benefits and other options for treatment, the patient has consented to  Procedure(s): ?DILATATION & CURETTAGE/HYSTEROSCOPY WITH POSSIBLE MYOSURE (N/A) as a surgical intervention.  The patient's history has been reviewed, patient examined, no change in status, stable for surgery.  I have reviewed the patient's chart and labs.  Questions were answered to the patient's satisfaction.   ? ? ?Yesenia [name] ? ? ?

## 2021-09-10 NOTE — Transfer of Care (Signed)
Immediate Anesthesia Transfer of Care Note ? ?Patient: Yesenia [name] ? ?Procedure(s) Performed: Procedure(s) (LRB): ?DILATATION & CURETTAGE/HYSTEROSCOPY WITH POSSIBLE MYOSURE (N/A) ? ?Patient Location: PACU ? ?Anesthesia Type: General ? ?Level of Consciousness: awake, sedated, patient cooperative and responds to stimulation ? ?Airway & Oxygen Therapy: Patient Spontanous Breathing and Patient connected to [place] 02 and soft FM ? ?Post-op Assessment: Report given to PACU RN, Post -op Vital signs reviewed and stable and Patient moving all extremities ? ?Post vital signs: Reviewed and stable ? ?Complications: No apparent anesthesia complications ?

## 2021-09-10 NOTE — Anesthesia Procedure Notes (Signed)
Procedure Name: LMA Insertion ?Date/Time: 09/10/2021 7:35 AM ?Performed by: Jessica Priest, CRNA ?Pre-anesthesia Checklist: Patient identified, Emergency Drugs available, Suction available, Patient being monitored and Timeout performed ?Patient Re-evaluated:Patient Re-evaluated prior to induction ?Oxygen Delivery Method: Circle system utilized ?Preoxygenation: Pre-oxygenation with 100% oxygen ?Induction Type: IV induction ?Ventilation: Mask ventilation without difficulty ?LMA: LMA inserted ?LMA Size: 4.0 ?Number of attempts: 1 ?Airway Equipment and Method: Bite block ?Placement Confirmation: positive ETCO2, breath sounds checked- equal and bilateral and CO2 detector ?Tube secured with: Tape ?Dental Injury: Teeth and Oropharynx as per pre-operative assessment  ? ? ? ? ?

## 2021-09-10 NOTE — Op Note (Signed)
NAME: Yesenia [name], Yesenia [name] ?MEDICAL RECORD NO: [id number] ?ACCOUNT NO: [id number] ?DATE OF BIRTH: [birth year] ?FACILITY: [facility] ?LOCATION: [facility] ?PHYSICIAN: Sherian Rein, MD ? ?Operative Report  ? ?DATE OF PROCEDURE: 09/10/2021 ? ?PREOPERATIVE DIAGNOSIS:  Abnormal uterine bleeding, polyp of uterus. ? ?POSTOPERATIVE DIAGNOSIS:  Abnormal uterine bleeding, polyp of uterus. ? ?PROCEDURE:  Hysteroscopy, D and C with MyoSure. ? ?SURGEON:  Sherian Rein, MD ? ?ANESTHESIA:  General by LMA. ? ?ESTIMATED BLOOD LOSS:  Approximately 30 mL ? ?IV FLUIDS:  Per Anesthesia. ? ?URINE OUTPUT:  Voided directly before the case. ? ?COMPLICATIONS:  None. ? ?PATHOLOGY:  Endometrial curettings and uterine polyp. ? ?DESCRIPTION OF PROCEDURE:  After informed consent was reviewed with the patient, including risks, benefits and alternatives of the surgical procedure, she was transported to the operating room and placed on the table in supine position.  General  ?anesthesia was induced and found to be adequate.  After an appropriate timeout was performed, she was prepped and draped. Her cervix was easily visualized with an open tooth speculum.  Her cervix was injected with 10 mL for a paracervical block with 1%  ?lidocaine.  The anterior lip of the cervix was grasped with a single tooth tenaculum.  The uterus was dilated to accommodate an operative hysteroscope.  Hysteroscope was introduced revealing thickened endometrium, likely polyp in the anterior surface.   ?Both ostia were easily visualized.  The MyoSure device was used to remove the thickened endometrium.  A sharp curettage was performed.  The fluid deficit was found to be 185 mL at the completion of the case. There was minimal bleeding at the tenaculum  ?site.  This was controlled with pressure.  Instruments were removed from the vagina.  The patient was returned to the supine position.  She tolerated the procedure well.  Sponge, lap and needle counts were correct x 2 at the  end of the procedure. ? ? ?[facility] ?D: 09/10/2021 8:23:51 am T: 09/10/2021 9:47:00 am  ?JOB: [id number]/ [id number]  ?

## 2021-09-10 NOTE — Brief Op Note (Signed)
09/10/2021 ? ?8:19 AM ? ?PATIENT:  Yesenia [name]  41 y.o. female ? ?PRE-OPERATIVE DIAGNOSIS:  polyp pf corpus uteri ? ?POST-OPERATIVE DIAGNOSIS:  polyp pf corpus uteri ? ?PROCEDURE:  Procedure(s): ?DILATATION & CURETTAGE/HYSTEROSCOPY WITH POSSIBLE MYOSURE (N/A) ? ?SURGEON:  Surgeon(s) and Role: ?   * Bovard-Stuckert, Augusto Gamble, MD - Primary ? ? ?ANESTHESIA:   general by LMA ? ?EBL:  30 mL IVF per anesthesia, uop - voided directly before case ? ?DRAINS: none  ? ?LOCAL MEDICATIONS USED:  LIDOCAINE  ? ?SPECIMEN:  Source of Specimen:  Endometrial curetting, uterine polyp ? ?DISPOSITION OF SPECIMEN:  PATHOLOGY ? ?COUNTS:  YES ? ?TOURNIQUET:  * No tourniquets in log * ? ?DICTATION: .Other Dictation: Dictation Number [id number] ? ?PLAN OF CARE: Discharge to home after PACU ? ?PATIENT DISPOSITION:  PACU - hemodynamically stable. ?  ?Delay start of Pharmacological VTE agent (>24hrs) due to surgical blood loss or risk of bleeding: yes ? ?

## 2021-09-10 NOTE — Anesthesia Postprocedure Evaluation (Signed)
Anesthesia Post Note ? ?Patient: Yesenia [name] ? ?Procedure(s) Performed: DILATATION & CURETTAGE/HYSTEROSCOPY WITH POSSIBLE MYOSURE (Uterus) ? ?  ? ?Patient location during evaluation: PACU ?Anesthesia Type: General ?Level of consciousness: awake and alert ?Pain management: pain level controlled ?Vital Signs Assessment: post-procedure vital signs reviewed and stable ?Respiratory status: spontaneous breathing, nonlabored ventilation and respiratory function stable ?Cardiovascular status: stable and blood pressure returned to baseline ?Anesthetic complications: no ? ? ?No notable events documented. ? ?Last Vitals:  ?Vitals:  ? 09/10/21 0845 09/10/21 0900  ?BP: 115/66 113/76  ?Pulse: (!) 52 (!) 57  ?Resp: 16 15  ?Temp:    ?SpO2: 99% 99%  ?  ?Last Pain:  ?Vitals:  ? 09/10/21 0900  ?TempSrc:   ?PainSc: 0-No pain  ? ? ?  ?  ?  ?  ?  ?  ? ?Beryle Lathe ? ? ? ? ?

## 2021-09-10 NOTE — Discharge Instructions (Signed)
?  Post Anesthesia Home Care Instructions ? ?Activity: ?Get plenty of rest for the remainder of the day. A responsible individual must stay with you for 24 hours following the procedure.  ?For the next 24 hours, DO NOT: ?-Drive a car ?-Paediatric nurse ?-Drink alcoholic beverages ?-Take any medication unless instructed by your physician ?-Make any legal decisions or sign important papers. ? ?Meals: ?Start with liquid foods such as gelatin or soup. Progress to regular foods as tolerated. Avoid greasy, spicy, heavy foods. If nausea and/or vomiting occur, drink only clear liquids until the nausea and/or vomiting subsides. Call your physician if vomiting continues. ? ?Special Instructions/Symptoms: ?Your throat may feel dry or sore from the anesthesia or the breathing tube placed in your throat during surgery. If this causes discomfort, gargle with warm salt water. The discomfort should disappear within 24 hours. ? ?If you had a scopolamine patch placed behind your ear for the management of post- operative nausea and/or vomiting: ? ?1. The medication in the patch is effective for 72 hours, after which it should be removed.  Wrap patch in a tissue and discard in the trash. Wash hands thoroughly with soap and water. ?2. You may remove the patch earlier than 72 hours if you experience unpleasant side effects which may include dry mouth, dizziness or visual disturbances. ?3. Avoid touching the patch. Wash your hands with soap and water after contact with the patch. ?   ?No ibuprofen, Advil, Aleve, Motrin, ketorolac, meloxicam, naproxen, or other NSAIDS until after 12:30 pm today if needed. ?No acetaminophen/Tylenol until after 12:30 pm today if needed. ? ? ? ?[facility] Home care Instructions: ? ? ?Personal hygiene:  Used sanitary napkins for vaginal drainage not tampons. Shower or tub bathe the day after your procedure. No douching until bleeding stops. Always wipe from front to back after  Elimination. ? ?Activity: Do not  drive or operate any equipment today. The effects of the anesthesia are still present and drowsiness may result. Rest today, not necessarily flat bed rest, just take it easy. You may resume your normal activity in one to 2 days. ? ?Sexual activity: No intercourse for one week or as indicated by your physician ? ?Diet: Eat a light diet as desired this evening. You may resume a regular diet tomorrow. ? ?Return to work: One to 2 days. ? ?General Expectations of your surgery: Vaginal bleeding should be no heavier than a normal period. Spotting may continue up to 10 days. Mild cramps may continue for a couple of days. You may have a regular period in 2-6 weeks. ? ?Unexpected observations call your doctor if these occur: persistent or heavy bleeding. Severe abdominal cramping or pain. Elevation of temperature greater than 100?F. ? ?Call for an appointment. ? ? ?

## 2021-09-13 ENCOUNTER — Encounter (HOSPITAL_BASED_OUTPATIENT_CLINIC_OR_DEPARTMENT_OTHER): Payer: Self-pay | Admitting: Obstetrics and Gynecology

## 2021-09-13 LAB — SURGICAL PATHOLOGY

## 2022-01-05 ENCOUNTER — Ambulatory Visit: Payer: BC Managed Care – PPO | Admitting: Podiatry

## 2022-01-05 ENCOUNTER — Ambulatory Visit (INDEPENDENT_AMBULATORY_CARE_PROVIDER_SITE_OTHER): Payer: BC Managed Care – PPO

## 2022-01-05 DIAGNOSIS — M7662 Achilles tendinitis, left leg: Secondary | ICD-10-CM

## 2022-01-05 DIAGNOSIS — M25572 Pain in left ankle and joints of left foot: Secondary | ICD-10-CM

## 2022-01-05 DIAGNOSIS — M25571 Pain in right ankle and joints of right foot: Secondary | ICD-10-CM

## 2022-01-05 NOTE — Progress Notes (Signed)
Chief Complaint  Patient presents with   Foot Injury    Started 3 mths ago, playing tennis, pt possibly injury the left tendon,back of the heel, pt is still having pain rate of pain 3 out of 10, X-rays taken     HPI: 41 y.o. female presenting today as a new patient for a second opinion regarding left posterior ankle pain.  Patient states that about 4 weeks ago she sustained an injury while playing tennis.  She noticed the following 2 weeks that there was some improvement but she went to [facility] and they placed her in a walking cam boot.  Patient states that the pain increased with the boot so she discontinued it.  Patient also noticed over the past 2 weeks she has continued to notice improvement.  She takes OTC ibuprofen as needed.  She presents for second opinion and for further treatment and evaluation  Past Medical History:  Diagnosis Date   Abnormal uterine bleeding (AUB) 09/01/2021   Asthma    COVID    nov 2022 high fever, fatigue, cough X 6 days   Migraine    SVD (spontaneous vaginal delivery) 06/13/2017   Wears glasses     Past Surgical History:  Procedure Laterality Date   DILATATION & CURETTAGE/HYSTEROSCOPY WITH MYOSURE N/A 09/10/2021   Procedure: DILATATION & CURETTAGE/HYSTEROSCOPY WITH POSSIBLE MYOSURE;  Surgeon: Sherian Rein, MD;  Location: [facility];  Service: Gynecology;  Laterality: N/A;   NO PAST SURGERIES      Not on File   Physical Exam: General: The patient is alert and oriented x3 in no acute distress.  Dermatology: Skin is warm, dry and supple bilateral lower extremities. Negative for open lesions or macerations.  Vascular: Palpable pedal pulses bilaterally. Capillary refill within normal limits.  Negative for any significant edema or erythema  Neurological: Light touch and protective threshold grossly intact  Musculoskeletal Exam: No pedal deformities noted.  There is some mild pain with palpation along the peroneal tendons  as well as the posterior Achilles tendon left lower extremity.  Slight tenderness with resistance as well.  Radiographic Exam:  Normal osseous mineralization. Joint spaces preserved. No fracture/dislocation/boney destruction.    Assessment: 1.  Achilles tendinitis/peroneal tendinitis left lower extremity with improvement   Plan of Care:  1. Patient evaluated. X-Rays reviewed.  2.  Discontinue cam boot.  I do not see any benefit in resuming the cam boot especially since she is noticing improvement without it 3.  Continue OTC Motrin as needed 4.  Recommend daily stretching exercises 5.  Advised against any explosive dynamic movements for the next 1-2 months until symptoms completely resolve 6.  Physical therapy was offered but declined.  Recommend daily stretching at home 7.  Return to clinic as needed.  If the patient feels as if there is no improvement or if symptoms are increasing she may contact the office and we will order an MRI left ankle to rule out tendon rupture or partial tear      Felecia Shelling, DPM [facility]  Dr. Felecia Shelling, DPM    [address], [place] [address]                Office [phone]  Fax [phone]

## 2022-01-08 ENCOUNTER — Other Ambulatory Visit: Payer: Self-pay | Admitting: Podiatry

## 2022-01-08 DIAGNOSIS — M7662 Achilles tendinitis, left leg: Secondary | ICD-10-CM

## 2023-02-10 ENCOUNTER — Ambulatory Visit: Payer: BC Managed Care – PPO | Admitting: Podiatry

## 2023-02-10 ENCOUNTER — Other Ambulatory Visit: Payer: Self-pay

## 2023-02-10 ENCOUNTER — Ambulatory Visit: Payer: BC Managed Care – PPO

## 2023-02-10 ENCOUNTER — Encounter: Payer: Self-pay | Admitting: Podiatry

## 2023-02-10 DIAGNOSIS — M79671 Pain in right foot: Secondary | ICD-10-CM | POA: Diagnosis not present

## 2023-02-10 DIAGNOSIS — M7661 Achilles tendinitis, right leg: Secondary | ICD-10-CM

## 2023-02-10 DIAGNOSIS — M722 Plantar fascial fibromatosis: Secondary | ICD-10-CM

## 2023-02-10 MED ORDER — MELOXICAM 15 MG PO TABS: 15.0000 mg | ORAL_TABLET | Freq: Every day | ORAL | 0 refills | Status: AC

## 2023-02-10 NOTE — Patient Instructions (Signed)

## 2023-02-10 NOTE — Progress Notes (Signed)
  Subjective:  Patient ID: Yesenia [name], female    DOB: 08/19/1980,   MRN: [id number]  Chief Complaint  Patient presents with   Foot Pain    Pt presents for pain in the right heel that has been going on for 3 weeks now.    42 y.o. female presents for concern of right heel pain that has been ongoing for almost 4 weeks now. Relates the bottom of the heel and some pain in the back have been present. Relates first steps in the morning and after rest are the most painful. Has been stretching, wearing supporting, icing and taking ibuprofen.  . Denies any other pedal complaints. Denies n/v/f/c.   Past Medical History:  Diagnosis Date   Abnormal uterine bleeding (AUB) 09/01/2021   Asthma    COVID    nov 2022 high fever, fatigue, cough X 6 days   Migraine    SVD (spontaneous vaginal delivery) 06/13/2017   Wears glasses     Objective:  Physical Exam: Vascular: DP/PT pulses 2/4 bilateral. CFT <3 seconds. Normal hair growth on digits. No edema.  Skin. No lacerations or abrasions bilateral feet.  Musculoskeletal: MMT 5/5 bilateral lower extremities in DF, PF, Inversion and Eversion. Deceased ROM in DF of ankle joint. Tender to the medial calcaneal tubercle right . No pain with  PT or arch. No pain with calcaneal squeeze. Some pain to distal achilles insertion site.  Neurological: Sensation intact to light touch.   Assessment:   1. Plantar fasciitis, right   2. Tendonitis, Achilles, right      Plan:  Patient was evaluated and treated and all questions answered. Discussed plantar fasciitis with patient.  X-rays reviewed and discussed with patient. No acute fractures or dislocations noted. Very minimal spurring noted at inferior calcaneus. Some joint space narrowing noted at first MPJ on right.  Discussed treatment options including, ice, NSAIDS, supportive shoes, bracing, and stretching. Stretching exercises provided to be done on a daily basis.   Prescription for meloxicam provided and  sent to pharmacy. Most recent CMP within normal limits.  PF brace  Follow-up 6 weeks or sooner if any problems arise. In the meantime, encouraged to call the office with any questions, concerns, change in symptoms.      Louann Sjogren, DPM

## 2024-02-05 ENCOUNTER — Telehealth: Payer: Self-pay | Admitting: Podiatry

## 2024-02-05 NOTE — Telephone Encounter (Signed)
 Called on 02/05/24 LVM for pt to rs my chart appt. Pt scheduled appt. as a new pt. the last time pt was seen was in 2024and the first time was 2023 it has not been 3years yet please rs patient to see Dr.Evans JT

## 2024-02-15 ENCOUNTER — Encounter: Payer: Self-pay | Admitting: Podiatry

## 2024-02-15 ENCOUNTER — Ambulatory Visit (INDEPENDENT_AMBULATORY_CARE_PROVIDER_SITE_OTHER)

## 2024-02-15 ENCOUNTER — Ambulatory Visit: Admitting: Podiatry

## 2024-02-15 DIAGNOSIS — M722 Plantar fascial fibromatosis: Secondary | ICD-10-CM | POA: Diagnosis not present

## 2024-02-15 DIAGNOSIS — M21619 Bunion of unspecified foot: Secondary | ICD-10-CM

## 2024-02-15 DIAGNOSIS — M21611 Bunion of right foot: Secondary | ICD-10-CM

## 2024-02-15 DIAGNOSIS — M21612 Bunion of left foot: Secondary | ICD-10-CM

## 2024-02-15 MED ORDER — TRIAMCINOLONE ACETONIDE 10 MG/ML IJ SUSP
10.0000 mg | Freq: Once | INTRAMUSCULAR | Status: AC
  Administered 2024-02-15: 10 mg via INTRA_ARTICULAR

## 2024-02-15 MED ORDER — DICLOFENAC SODIUM 75 MG PO TBEC: 75.0000 mg | DELAYED_RELEASE_TABLET | Freq: Two times a day (BID) | ORAL | 2 refills | Status: AC

## 2024-02-15 NOTE — Patient Instructions (Signed)

## 2024-02-16 NOTE — Progress Notes (Signed)
 Subjective:   Patient ID: Yesenia [name], female   DOB: 43 y.o.   MRN: [id number]   HPI Patient presents with a lot of acute pain in the right heel of recent duration and structural bunion deformity left over right with prominence around the metatarsal head left redness and pain.  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   ROS      Objective:  Physical Exam  Neurovascular status intact with exquisite discomfort in the right plantar fascia at the insertional point tendon calcaneus fluid buildup within the medial bed moderate depression of the arch with structural bunion deformity left over right redness and quite a bit of pain with the possibility of the nerve being compressed against the structural deformity.  Good digital perfusion well-oriented     Assessment:  Acute plantar fasciitis right with structural bunion deformity left over right     Plan:  H&P x-rays reviewed discussed bunion and I do think distal osteotomy would do excellent for her and reviewed recovery.  For the right I did do sterile prep injected the fascia at insertion 3 mg Kenalog 5 mg Xylocaine  applied fascial brace properly fitted into the arch and instructed on oral anti-inflammatories as needed and ice therapy shoe gear modifications stretching  X-rays bilateral indicate small spur right no indication stress fracture moderate bunion deformity with elevation of the intermetatarsal angle left over right

## 2024-03-07 ENCOUNTER — Ambulatory Visit: Admitting: Podiatry

## 2024-05-27 ENCOUNTER — Ambulatory Visit: Admitting: Podiatry
